# Patient Record
Sex: Female | Born: 1980 | Race: Black or African American | Hispanic: No | Marital: Single | Smoking: Never smoker
Health system: Southern US, Community
[De-identification: ages and names within clinical notes are randomized; demographics above are authoritative.]

## PROBLEM LIST (undated history)

## (undated) DIAGNOSIS — Z8759 Personal history of other complications of pregnancy, childbirth and the puerperium: Secondary | ICD-10-CM

## (undated) DIAGNOSIS — Z8619 Personal history of other infectious and parasitic diseases: Secondary | ICD-10-CM

## (undated) DIAGNOSIS — Z789 Other specified health status: Secondary | ICD-10-CM

## (undated) DIAGNOSIS — D6859 Other primary thrombophilia: Secondary | ICD-10-CM

## (undated) DIAGNOSIS — D649 Anemia, unspecified: Secondary | ICD-10-CM

## (undated) HISTORY — DX: Other primary thrombophilia: D68.59

## (undated) HISTORY — DX: Personal history of other complications of pregnancy, childbirth and the puerperium: Z87.59

## (undated) HISTORY — DX: Personal history of other infectious and parasitic diseases: Z86.19

---

## 2003-12-23 ENCOUNTER — Emergency Department (HOSPITAL_COMMUNITY): Admission: EM | Admit: 2003-12-23 | Discharge: 2003-12-23 | Payer: Self-pay | Admitting: Emergency Medicine

## 2004-06-04 ENCOUNTER — Emergency Department (HOSPITAL_COMMUNITY): Admission: EM | Admit: 2004-06-04 | Discharge: 2004-06-04 | Payer: Self-pay | Admitting: Emergency Medicine

## 2004-09-20 ENCOUNTER — Emergency Department (HOSPITAL_COMMUNITY): Admission: EM | Admit: 2004-09-20 | Discharge: 2004-09-20 | Payer: Self-pay | Admitting: Emergency Medicine

## 2004-12-31 ENCOUNTER — Inpatient Hospital Stay (HOSPITAL_COMMUNITY): Admission: AD | Admit: 2004-12-31 | Discharge: 2004-12-31 | Payer: Self-pay | Admitting: *Deleted

## 2005-01-11 ENCOUNTER — Inpatient Hospital Stay (HOSPITAL_COMMUNITY): Admission: AD | Admit: 2005-01-11 | Discharge: 2005-01-11 | Payer: Self-pay | Admitting: *Deleted

## 2005-05-10 ENCOUNTER — Ambulatory Visit (HOSPITAL_COMMUNITY): Admission: RE | Admit: 2005-05-10 | Discharge: 2005-05-10 | Payer: Self-pay | Admitting: Obstetrics and Gynecology

## 2005-05-20 ENCOUNTER — Inpatient Hospital Stay (HOSPITAL_COMMUNITY): Admission: AD | Admit: 2005-05-20 | Discharge: 2005-05-20 | Payer: Self-pay | Admitting: Obstetrics and Gynecology

## 2005-05-23 ENCOUNTER — Inpatient Hospital Stay (HOSPITAL_COMMUNITY): Admission: AD | Admit: 2005-05-23 | Discharge: 2005-05-26 | Payer: Self-pay | Admitting: Obstetrics and Gynecology

## 2005-05-23 ENCOUNTER — Encounter (INDEPENDENT_AMBULATORY_CARE_PROVIDER_SITE_OTHER): Payer: Self-pay | Admitting: Specialist

## 2005-06-17 IMAGING — US US OB FOLLOW-UP
1 series · 18 of 23 positions shown · non-contrast
Comparison: none

CLINICAL DATA: 23-year-old.  G1 P0 with LMP of 08/23/04.

[Series 1: us ob re-eval · 18 of 23 slices shown]
[im 1/23]
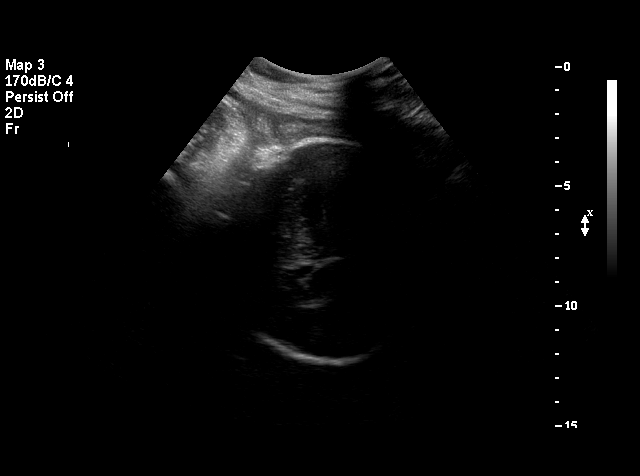
[im 2/23]
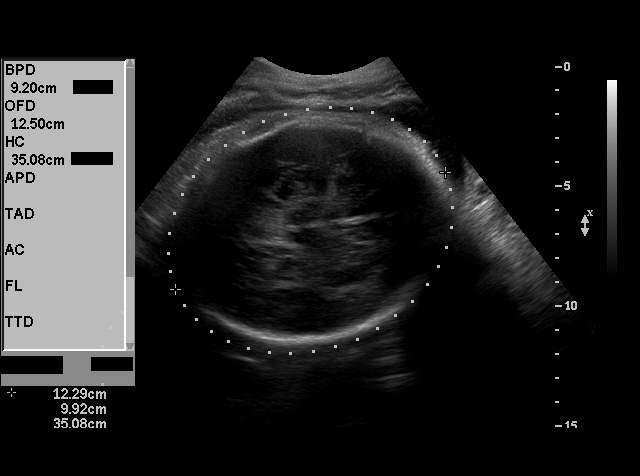
[im 4/23]
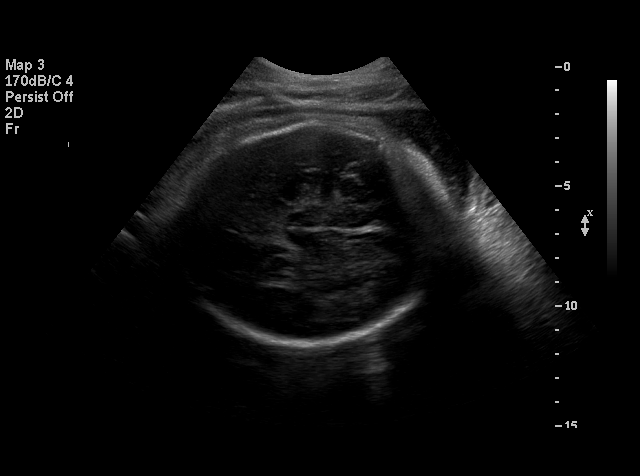
[im 5/23]
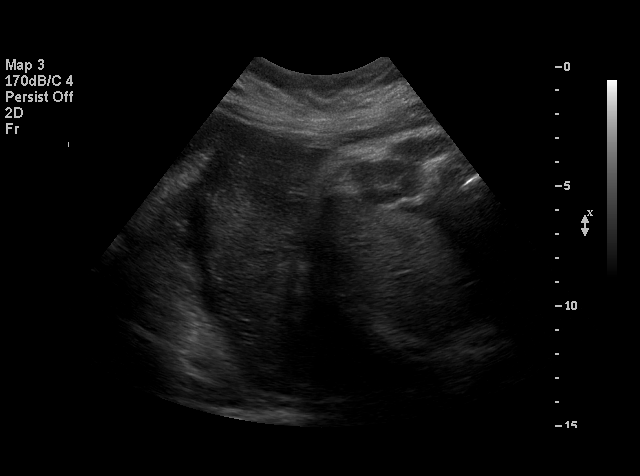
[im 6/23]
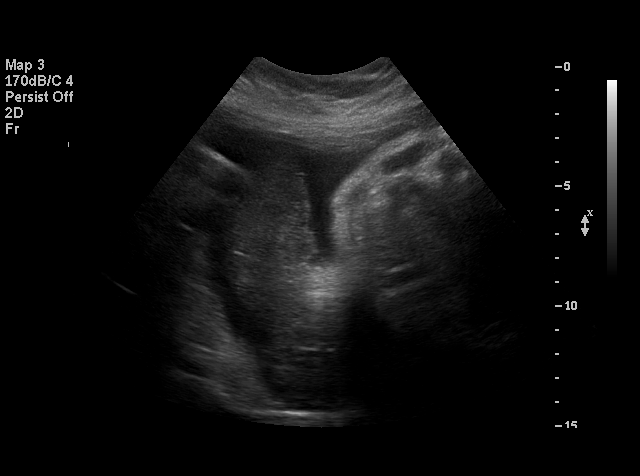
[im 8/23]
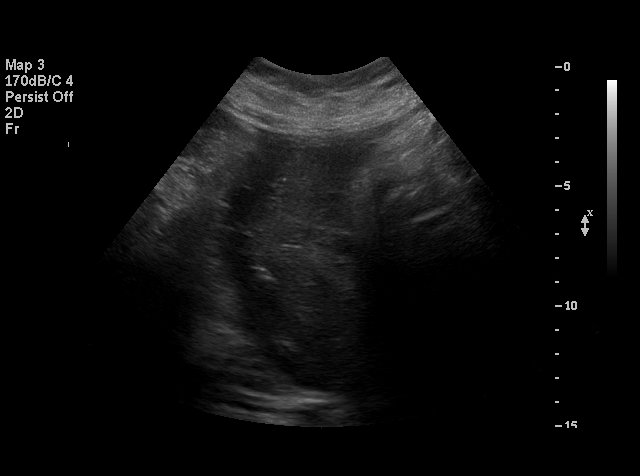
[im 9/23]
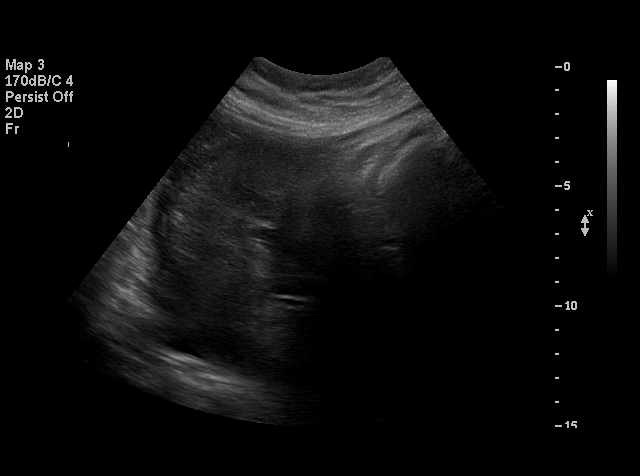
[im 10/23]
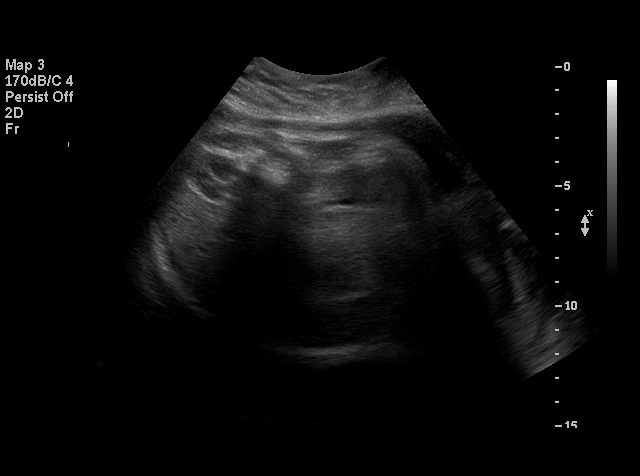
[im 11/23]
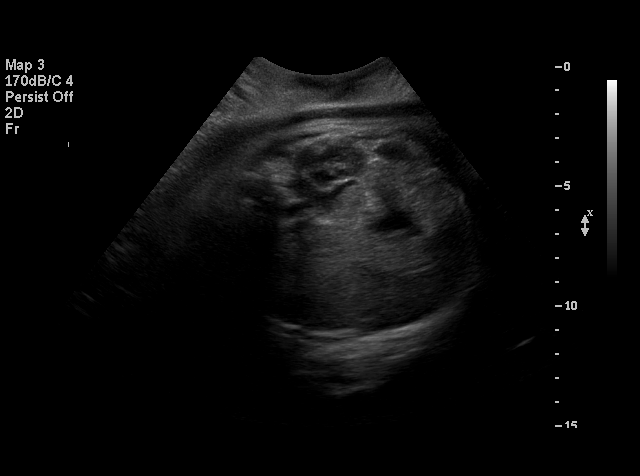
[im 13/23]
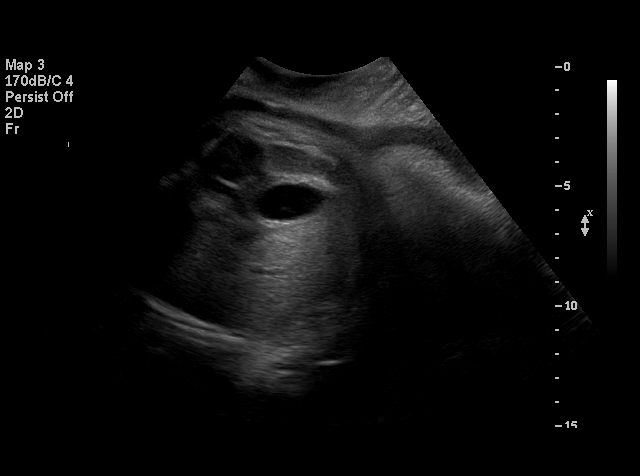
[im 14/23]
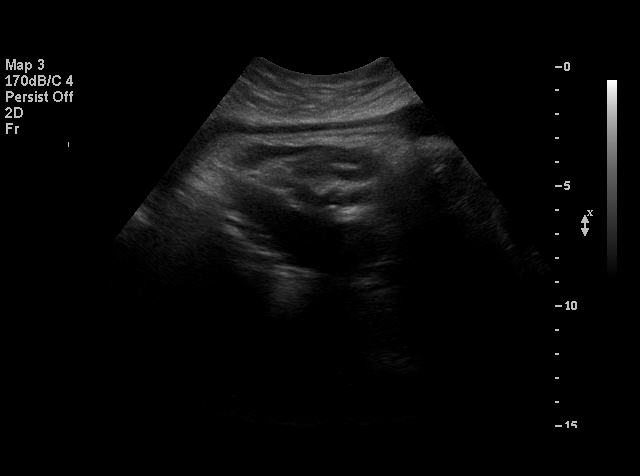
[im 15/23]
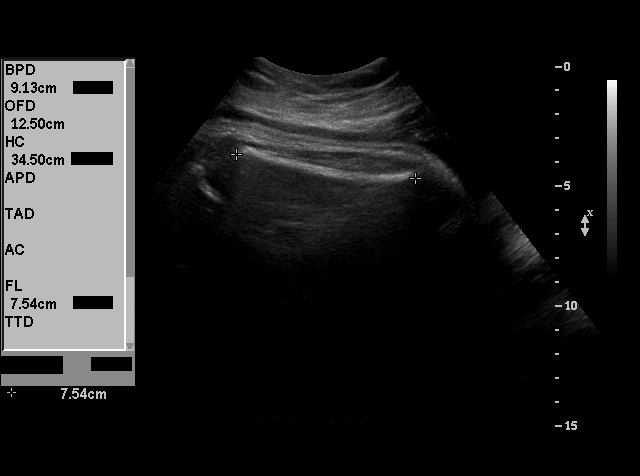
[im 16/23]
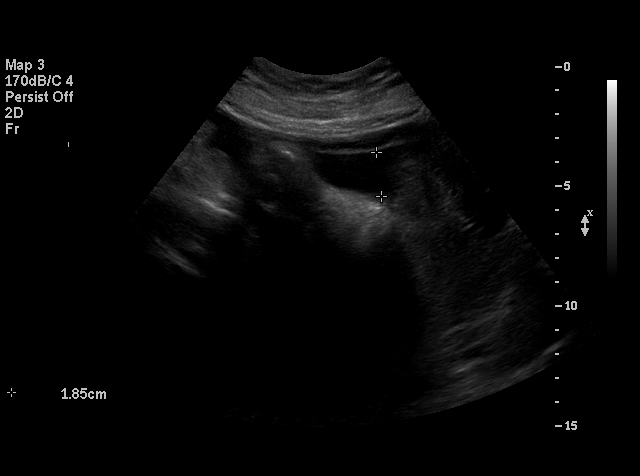
[im 18/23]
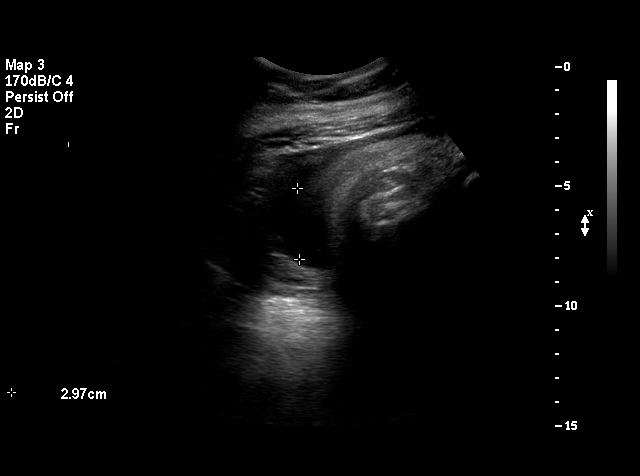
[im 19/23]
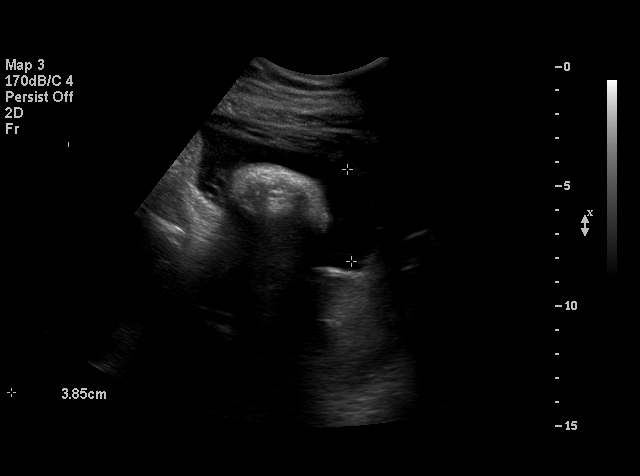
[im 20/23]
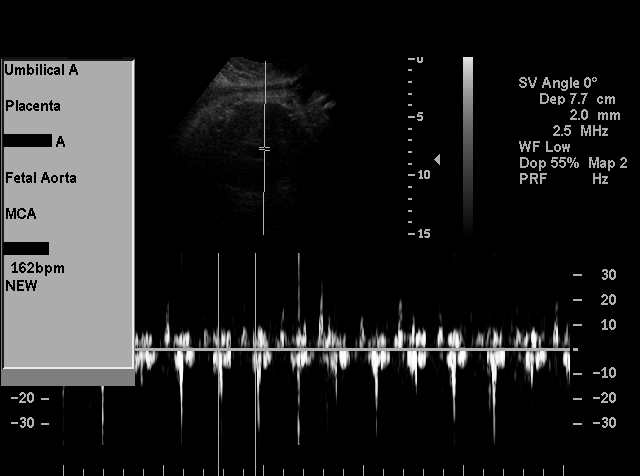
[im 22/23]
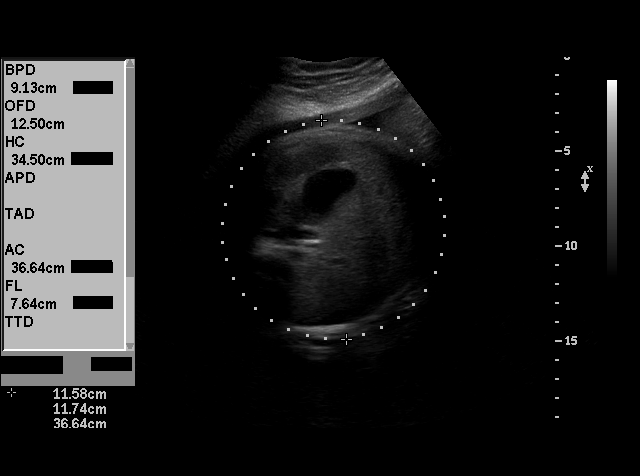
[im 23/23]
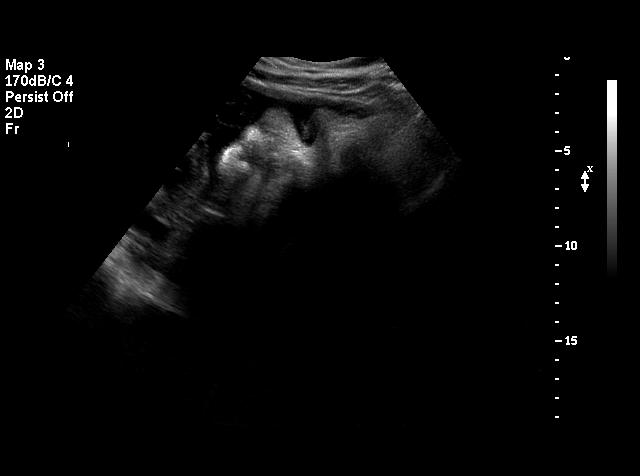

[18 of 23 positions shown; findings below may reference images not displayed]

OBSTETRICAL ULTRASOUND RE-EVALUATION:
Number of Fetuses:  1
Heart Rate:  162
Movement:  Yes
Breathing:  Yes
Presentation:  Cephalic
Placental Location:  Posterior
Grade:  II
Previa:  No
Amniotic Fluid (subjective):  Normal
Amniotic Fluid (objective):  11.3 cm AFI (5th -95th%ile = 7.5 ? 24.4 cm for 37 wks)

FETAL BIOMETRY
BPD:  9.1 cm   37 w 1 d
HC:  34.8 cm   40 w 3 d
AC:  36.3 cm   40 w 2 d
FL:  7.6 cm   39 w 0 d

Mean GA:  39 w 2 d
Assigned GA:  37 w 1 d

EFW:  5929 g (H) Greater than 90th%ile (90th = 5885 g) For 37 wks

FETAL ANATOMY
Lateral Ventricles:  Visualized 
Thalami/CSP:  Previously seen 
Posterior Fossa:  Previously seen 
Nuchal Region:  Previously seen 
Spine:  Previously seen 
4 Chamber Heart on Left:  Previously seen 
Stomach on Left:  Visualized 
3 Vessel Cord:  Previously seen 
Cord Insertion Site:  Previously seen 
Kidneys:  Visualized   
Bladder:  Visualized 
Extremities:  Previously seen 

MATERNAL UTERINE AND ADNEXAL FINDINGS
Cervix:  Not evaluated.
IMPRESSION: 1.  Single living intrauterine fetus in cephalic presentation which is large for gestational age. Estimated fetal weight is greater than 90th percentile for 37 weeks.  Abdominal circumference measures 40 weeks and 2 days.  
2.  Normal amniotic fluid volume.

## 2005-07-01 ENCOUNTER — Other Ambulatory Visit: Admission: RE | Admit: 2005-07-01 | Discharge: 2005-07-01 | Payer: Self-pay | Admitting: Obstetrics and Gynecology

## 2006-05-10 ENCOUNTER — Emergency Department: Payer: Self-pay | Admitting: Emergency Medicine

## 2007-06-13 ENCOUNTER — Emergency Department: Payer: Self-pay | Admitting: Emergency Medicine

## 2007-06-14 ENCOUNTER — Emergency Department: Payer: Self-pay | Admitting: Emergency Medicine

## 2008-02-08 ENCOUNTER — Emergency Department (HOSPITAL_COMMUNITY): Admission: EM | Admit: 2008-02-08 | Discharge: 2008-02-08 | Payer: Self-pay | Admitting: Emergency Medicine

## 2008-04-29 ENCOUNTER — Ambulatory Visit: Payer: Self-pay | Admitting: Family Medicine

## 2009-09-18 ENCOUNTER — Emergency Department (HOSPITAL_COMMUNITY): Admission: EM | Admit: 2009-09-18 | Discharge: 2009-09-18 | Payer: Self-pay | Admitting: Emergency Medicine

## 2010-01-16 ENCOUNTER — Emergency Department (HOSPITAL_COMMUNITY): Admission: EM | Admit: 2010-01-16 | Discharge: 2010-01-16 | Payer: Self-pay | Admitting: Emergency Medicine

## 2011-02-22 ENCOUNTER — Emergency Department (HOSPITAL_COMMUNITY)
Admission: EM | Admit: 2011-02-22 | Discharge: 2011-02-22 | Disposition: A | Discharge status: Home / Self Care | Payer: Self-pay | Attending: Emergency Medicine | Admitting: Emergency Medicine

## 2011-02-22 DIAGNOSIS — R300 Dysuria: Secondary | ICD-10-CM | POA: Insufficient documentation

## 2011-02-22 LAB — URINE MICROSCOPIC-ADD ON

## 2011-02-22 LAB — URINALYSIS, ROUTINE W REFLEX MICROSCOPIC
Bilirubin Urine: NEGATIVE
Ketones, ur: NEGATIVE mg/dL
Nitrite: NEGATIVE
Protein, ur: NEGATIVE mg/dL
Specific Gravity, Urine: 1.017 (ref 1.005–1.030)
Urine Glucose, Fasting: NEGATIVE mg/dL
Urobilinogen, UA: 0.2 mg/dL (ref 0.0–1.0)
pH: 7 (ref 5.0–8.0)

## 2011-02-22 LAB — POCT PREGNANCY, URINE: Preg Test, Ur: NEGATIVE

## 2011-03-29 LAB — PREGNANCY, URINE: Preg Test, Ur: NEGATIVE

## 2011-05-10 NOTE — Op Note (Signed)
NAMEMARVENA, [name]           ACCOUNT NO.:  [id number]   MEDICAL RECORD NO.:  [id number]          PATIENT TYPE:  INP   LOCATION:  9107                          FACILITY:  [facility]   PHYSICIAN:  James A. Ashley Royalty, M.D.DATE OF BIRTH:  1981/06/28   DATE OF PROCEDURE:  05/23/2005  DATE OF DISCHARGE:                                 OPERATIVE REPORT   PREOPERATIVE DIAGNOSES:  1.  Intrauterine pregnancy at term.  2.  Meconium-stained amniotic fluid.  3.  Late decelerations.   POSTOPERATIVE DIAGNOSES:  1.  Intrauterine pregnancy at term.  2.  Meconium-stained amniotic fluid.  3.  Late decelerations.  4.  Pathology pending.   OPERATION/PROCEDURE:  Primary low transverse cesarean section.   SURGEON:  Rudy Jew. Ashley Royalty, M.D.   ANESTHESIA:  Epidural.   FINDINGS:  A 7 pound 13 ounce female, Apgars 8 at one minute and 9 at five  minutes, sent to the newborn nursery.  Arterial cord PH 7.27.  Sponge,  needle and instrument count reported as correct x2.   ESTIMATED BLOOD LOSS:  800 mL.   COMPLICATIONS:  None.   PACKS AND DRAINS:  Foley.   DESCRIPTION OF PROCEDURE:  The patient was taken to the operating room and  placed in the dorsal supine position.  Foley catheter had been previously  placed.  Epidural anesthesia was dosed to surgical levels.  She was prepped  and draped in the usual manner for abdominal surgery.   A Pfannenstiel incision was made down to the level of the fascia.  The  fascia was nicked with the knife and incised transversely with Mayo  scissors.  Underlying rectus muscles were separated from the fascia using  sharp and blunt dissection.  The rectus muscles were severed in the midline  exposing the peritoneum which was elevated with hemostats atraumatically  with Metzenbaum scissors.  Incision was extended longitudinally.  The uterus  was identified and a bladder flap created by incising the anterior uterine  serosa.  The bladder was dissected inferiorly and held  in place with the  bladder blade.  The uterus was then entered for a low transverse incision  using sharp and blunt dissection.  The fluid was found to be meconium  stained.  The infant's head was delivered.  DeLee suction was employed at  this time of the oral and nasopharynx.  Full delivery was then accomplished.  The cord was triply clamped, cut and the infant given immediately to the  awaiting pediatrics team.  Arterial cord PH was taken __________  segment.  Then regular cord blood was obtained.  Placenta and membranes were removed  in their entirety and submitted to pathology for histologic studies.  The  uterus was exteriorized.  The uterus was closed with two running layers with  #1 Vicryl, reinforced in with a running locking layer.  The second was a  running, intermittent locking, and imbricating layer.  One additional figure-  of-eight suture was required to obtain hemostasis.  Hemostasis was noted.  The uterus, tubes and ovaries were inspected and found to be normal.  They  were returned to the abdominal cavity.  Copious irrigation was accomplished.  The peritoneum was then closed with 3-0 Vicryl in a running fashion.  The  fascia was closed with 0 Vicryl in a running fashion.  The skin was closed  with staples.  The patient tolerated the procedure extremely well and was  returned to the recovery room in good condition.      JAM/MEDQ  D:  05/23/2005  T:  05/24/2005  Job:  [id number]

## 2011-05-10 NOTE — Discharge Summary (Signed)
NAMEKRISY, DIX           ACCOUNT NO.:  [id number]   MEDICAL RECORD NO.:  [id number]          PATIENT TYPE:  INP   LOCATION:  9107                          FACILITY:  [facility]   PHYSICIAN:  James A. Ashley Royalty, M.D.DATE OF BIRTH:  1981-02-05   DATE OF ADMISSION:  05/23/2005  DATE OF DISCHARGE:  05/26/2005                                 DISCHARGE SUMMARY   ADMISSION DIAGNOSES:  1.  Intrauterine pregnancy at term.  2.  Meconium-stained fluid.  3.  Late decelerations.   DISCHARGE DIAGNOSES:  1.  Intrauterine pregnancy at term.  2.  Meconium-stained fluid.  3.  Late decelerations.  4.  Pathology pending.   PROCEDURES:  Low cervical transverse cesarean section.   HISTORY OF PRESENT ILLNESS:  The patient is a 30 year old primigravida with  an LMP of August 23, 2004; [facility] May 30, 2005. Prenatal course was  complicated by fibroid uterus. The patient also had a history of a positive  PPD with a normal chest x-ray 5 years ago. She transferred her care from  [facility] to [facility] at  approximately 21 weeks.   LABORATORY:  Blood type O positive, antibody screen negative. RPR, HbsAg,  HIV nonreactive.   HOSPITAL COURSE AND TREATMENT:  The patient was admitted on May 23, 2005  with spontaneous onset of labor. During the course of labor she began to  develop repetitive late decelerations. Primary low cervical transverse  cesarean section was then performed by Dr. Sylvester Harder under epidural  anesthesia. The patient was delivered of a 7-pound 13-ounce female, Apgars 8  and 9, arterial cord pH 7.27, estimated blood loss 800 mL.   POSTPARTUM COURSE:  The patient remained afebrile and had no difficulties,  was able to be discharged in satisfactory condition on May 26, 2005.   DISPOSITION:  Follow up in the office in 2 weeks.      Elwyn Lade  MKH/MEDQ  D:  07/03/2005  T:  07/03/2005  Job:  [id number]

## 2011-09-13 LAB — POCT PREGNANCY, URINE
Operator id: 108131
Preg Test, Ur: NEGATIVE

## 2012-01-20 ENCOUNTER — Emergency Department: Payer: Self-pay | Admitting: Emergency Medicine

## 2012-01-22 LAB — BETA STREP CULTURE(ARMC)

## 2012-05-15 ENCOUNTER — Emergency Department (INDEPENDENT_AMBULATORY_CARE_PROVIDER_SITE_OTHER)
Admission: EM | Admit: 2012-05-15 | Discharge: 2012-05-15 | Disposition: A | Discharge status: Home / Self Care | Payer: Self-pay | Source: Home / Self Care | Attending: Family Medicine | Admitting: Family Medicine

## 2012-05-15 ENCOUNTER — Encounter (HOSPITAL_COMMUNITY): Payer: Self-pay | Admitting: *Deleted

## 2012-05-15 DIAGNOSIS — J019 Acute sinusitis, unspecified: Secondary | ICD-10-CM

## 2012-05-15 DIAGNOSIS — H6690 Otitis media, unspecified, unspecified ear: Secondary | ICD-10-CM

## 2012-05-15 DIAGNOSIS — H6691 Otitis media, unspecified, right ear: Secondary | ICD-10-CM

## 2012-05-15 MED ORDER — FLUTICASONE PROPIONATE 50 MCG/ACT NA SUSP: 1.0000 | Freq: Two times a day (BID) | NASAL | Status: DC

## 2012-05-15 MED ORDER — CETIRIZINE HCL 10 MG PO TABS: 10.0000 mg | ORAL_TABLET | Freq: Every day | ORAL | Status: DC

## 2012-05-15 MED ORDER — AMOXICILLIN-POT CLAVULANATE 875-125 MG PO TABS: 1.0000 | ORAL_TABLET | Freq: Two times a day (BID) | ORAL | Status: AC

## 2012-05-15 NOTE — ED Provider Notes (Signed)
History     CSN: [id number]  Arrival date & time 05/15/12  4696   First MD Initiated Contact with Patient 05/15/12 0820      Chief Complaint  Patient presents with  . Facial Pain    (Consider location/radiation/quality/duration/timing/severity/associated sxs/prior treatment) Patient is a 31 y.o. female presenting with URI. The history is provided by the patient.  URI The primary symptoms include ear pain and cough. The current episode started 3 to 5 days ago. This is a new problem. The problem has not changed since onset. Symptoms associated with the illness include facial pain, sinus pressure, congestion and rhinorrhea.    History reviewed. No pertinent past medical history.  Past Surgical History  Procedure Date  . Cesarean section     Family History  Problem Relation Age of Onset  . Cancer Mother   . Cancer Father     History  Substance Use Topics  . Smoking status: Not on file  . Smokeless tobacco: Not on file  . Alcohol Use:     OB History    Grav Para Term Preterm Abortions TAB SAB Ect Mult Living                  Review of Systems  Constitutional: Negative.   HENT: Positive for ear pain, congestion, rhinorrhea and sinus pressure.   Respiratory: Positive for cough.     Allergies  Review of patient's allergies indicates no known allergies.  Home Medications   Current Outpatient Rx  Name Route Sig Dispense Refill  . DEXTROMETHORPHAN POLISTIREX ER 30 MG/5ML PO LQCR Oral Take 60 mg by mouth as needed.    . AMOXICILLIN-POT CLAVULANATE 875-125 MG PO TABS Oral Take 1 tablet by mouth 2 (two) times daily. 20 tablet 0  . CETIRIZINE HCL 10 MG PO TABS Oral Take 1 tablet (10 mg total) by mouth daily. One tab daily for allergies 30 tablet 1  . FLUTICASONE PROPIONATE 50 MCG/ACT NA SUSP Nasal Place 1 spray into the nose 2 (two) times daily. 1 g 2    BP 123/76  Pulse 90  Temp(Src) 98.3 F (36.8 C) (Oral)  Resp 18  SpO2 99%  LMP 05/13/2012  Physical Exam    Nursing note and vitals reviewed. Constitutional: She is oriented to person, place, and time. She appears well-developed and well-nourished.  HENT:  Head: Normocephalic.  Right Ear: Tympanic membrane is injected, erythematous and bulging.  Left Ear: Tympanic membrane and external ear normal.  Mouth/Throat: Oropharynx is clear and moist.  Neck: Normal range of motion. Neck supple.  Cardiovascular: Normal rate, regular rhythm and normal heart sounds.   Pulmonary/Chest: Breath sounds normal.  Lymphadenopathy:    She has no cervical adenopathy.  Neurological: She is alert and oriented to person, place, and time.  Skin: Skin is warm and dry.    ED Course  Procedures (including critical care time)  Labs Reviewed - No data to display No results found.   1. Otitis media of right ear   2. Sinusitis acute       MDM          Linna Hoff, MD 05/15/12 [phone]

## 2012-05-15 NOTE — ED Notes (Signed)
Face feels  Swollen      Congested   Fever    Earlier        Decreased   Hearing    r ear       Headache         And  Chest  Discomfort  After  Coughing     Symptoms  X  3  Days   Pt appears  In no  Severe  Distress  Speaking in  Complete  sentances

## 2012-05-15 NOTE — Discharge Instructions (Signed)
Take all of medicine , use tylenol or advil for pain and fever as needed, delsym for cough, drink lots of fluids. see your doctor in 10 - 14 days for ear recheck  As needed

## 2012-07-10 ENCOUNTER — Inpatient Hospital Stay (HOSPITAL_COMMUNITY)
Admission: AD | Admit: 2012-07-10 | Discharge: 2012-07-10 | Disposition: A | Discharge status: Home / Self Care | Payer: Self-pay | Source: Ambulatory Visit | Attending: Obstetrics & Gynecology | Admitting: Obstetrics & Gynecology

## 2012-07-10 ENCOUNTER — Encounter (HOSPITAL_COMMUNITY): Payer: Self-pay | Admitting: *Deleted

## 2012-07-10 ENCOUNTER — Inpatient Hospital Stay (HOSPITAL_COMMUNITY): Payer: Self-pay

## 2012-07-10 DIAGNOSIS — R102 Pelvic and perineal pain: Secondary | ICD-10-CM

## 2012-07-10 DIAGNOSIS — N949 Unspecified condition associated with female genital organs and menstrual cycle: Secondary | ICD-10-CM | POA: Insufficient documentation

## 2012-07-10 DIAGNOSIS — N72 Inflammatory disease of cervix uteri: Secondary | ICD-10-CM | POA: Insufficient documentation

## 2012-07-10 DIAGNOSIS — N912 Amenorrhea, unspecified: Secondary | ICD-10-CM

## 2012-07-10 DIAGNOSIS — R109 Unspecified abdominal pain: Secondary | ICD-10-CM | POA: Insufficient documentation

## 2012-07-10 HISTORY — DX: Other specified health status: Z78.9

## 2012-07-10 LAB — URINALYSIS, ROUTINE W REFLEX MICROSCOPIC
Bilirubin Urine: NEGATIVE
Glucose, UA: NEGATIVE mg/dL
Ketones, ur: NEGATIVE mg/dL
Leukocytes, UA: NEGATIVE
Nitrite: NEGATIVE
Protein, ur: NEGATIVE mg/dL
Specific Gravity, Urine: 1.025 (ref 1.005–1.030)
Urobilinogen, UA: 0.2 mg/dL (ref 0.0–1.0)
pH: 6 (ref 5.0–8.0)

## 2012-07-10 LAB — WET PREP, GENITAL
Clue Cells Wet Prep HPF POC: NONE SEEN
Trich, Wet Prep: NONE SEEN
Yeast Wet Prep HPF POC: NONE SEEN

## 2012-07-10 LAB — URINE MICROSCOPIC-ADD ON

## 2012-07-10 LAB — POCT PREGNANCY, URINE: Preg Test, Ur: NEGATIVE

## 2012-07-10 NOTE — MAU Note (Signed)
Patient states she has had abdominal pain on the right lower side for about 2 weeks. Has not had a period in 9 weeks and has had multiple negative pregnancy tests. Has gained a lot of weight and abdominal bloating on the right side.

## 2012-07-10 NOTE — MAU Note (Signed)
Pt reports she took Plan B May 23 and has not had a period since. Pregnancy test is negative.

## 2012-07-10 NOTE — MAU Provider Note (Signed)
History     CSN: [id number]  Arrival date and time: 07/10/12 9562   First Provider Initiated Contact with Patient 07/10/12 0940      Chief Complaint  Patient presents with  . Abdominal Pain   HPI  Pt reports she took Plan B May 23 and has not had a period since.  Pt reports regular cycles prior to taking Plan B.  LNMP was 05/09/12.   Pregnancy test is negative.  Pt report right sided pelvic pain that started two weeks ago.  Pain is described as bad cramps.     Past Medical History  Diagnosis Date  . No pertinent past medical history     Past Surgical History  Procedure Date  . Cesarean section     Family History  Problem Relation Age of Onset  . Cancer Mother   . Cancer Father     History  Substance Use Topics  . Smoking status: Never Smoker   . Smokeless tobacco: Not on file  . Alcohol Use: Yes     occasional    Allergies: No Known Allergies  Prescriptions prior to admission  Medication Sig Dispense Refill  . cetirizine (ZYRTEC) 10 MG tablet Take 1 tablet (10 mg total) by mouth daily. One tab daily for allergies  30 tablet  1  . fluticasone (FLONASE) 50 MCG/ACT nasal spray Place 1 spray into the nose 2 (two) times daily.  1 g  2    ROS Physical Exam   Blood pressure 126/88, pulse 90, temperature 98.5 F (36.9 C), temperature source Oral, resp. rate 18, height 5' 5.5" (1.664 m), weight 121.337 kg (267 lb 8 oz), last menstrual period 05/09/2012, SpO2 100.00%.  Physical Exam  Constitutional: She is oriented to person, place, and time. She appears well-developed and well-nourished. No distress.  HENT:  Head: Normocephalic.  Eyes: Pupils are equal, round, and reactive to light.  Neck: Normal range of motion. Neck supple.  Cardiovascular: Normal rate, regular rhythm and normal heart sounds.   Respiratory: Effort normal and breath sounds normal.  GI: Soft. Bowel sounds are normal. She exhibits no mass. There is no tenderness. There is no rebound and no  guarding.       Difficult to palpate well secondary to weight  Genitourinary: Vaginal discharge (thin, clear, mucusy) found.       Negative cervical motion tenderness  Neurological: She is alert and oriented to person, place, and time. She has normal reflexes.  Skin: Skin is warm and dry.    MAU Course  Procedures  Results for orders placed during the hospital encounter of 07/10/12 (from the past 24 hour(s))  POCT PREGNANCY, URINE     Status: Normal   Collection Time   07/10/12  8:42 AM      Component Value Range   Preg Test, Ur NEGATIVE  NEGATIVE  URINALYSIS, ROUTINE W REFLEX MICROSCOPIC     Status: Abnormal   Collection Time   07/10/12 10:12 AM      Component Value Range   Color, Urine YELLOW  YELLOW   APPearance CLEAR  CLEAR   Specific Gravity, Urine 1.025  1.005 - 1.030   pH 6.0  5.0 - 8.0   Glucose, UA NEGATIVE  NEGATIVE mg/dL   Hgb urine dipstick SMALL (*) NEGATIVE   Bilirubin Urine NEGATIVE  NEGATIVE   Ketones, ur NEGATIVE  NEGATIVE mg/dL   Protein, ur NEGATIVE  NEGATIVE mg/dL   Urobilinogen, UA 0.2  0.0 - 1.0 mg/dL   Nitrite  NEGATIVE  NEGATIVE   Leukocytes, UA NEGATIVE  NEGATIVE  URINE MICROSCOPIC-ADD ON     Status: Normal   Collection Time   07/10/12 10:12 AM      Component Value Range   Squamous Epithelial / LPF RARE  RARE   WBC, UA 0-2  <3 WBC/hpf   RBC / HPF 0-2  <3 RBC/hpf   Urine-Other MUCOUS PRESENT     Ultrasound: IMPRESSION:  Normal study. No evidence of pelvic mass or other significant  abnormality.   Assessment and Plan  Amenorrhea Pelvic Pain - exam/radiology normal  Plan: DC to home Provided reassurance If pain continues may need to check for non-gyn issue > refer to [facility] or go to urgent care/emergency room Pt declines provera, will wait and see cycle resumes Refer to gyn clinic for possible Mirena [facility] Scholars[facility])  [facility] 07/10/2012, 9:41 AM

## 2012-07-11 LAB — GC/CHLAMYDIA PROBE AMP, GENITAL
Chlamydia, DNA Probe: NEGATIVE
GC Probe Amp, Genital: NEGATIVE

## 2012-07-11 NOTE — MAU Provider Note (Signed)
Attestation of Attending Supervision of Advanced Practitioner (CNM/NP): Evaluation and management procedures were performed by the Advanced Practitioner under my supervision and collaboration.  I have reviewed the Advanced Practitioner's note and chart, and I agree with the management and plan.  HARRAWAY-SMITH, Ismael Treptow 3:03 PM     

## 2012-07-13 ENCOUNTER — Encounter: Payer: Self-pay | Admitting: *Deleted

## 2012-08-17 ENCOUNTER — Encounter: Payer: Self-pay | Admitting: Obstetrics & Gynecology

## 2012-08-17 ENCOUNTER — Ambulatory Visit (INDEPENDENT_AMBULATORY_CARE_PROVIDER_SITE_OTHER): Payer: Self-pay | Admitting: Obstetrics & Gynecology

## 2012-08-17 VITALS — BP 120/67 | HR 86 | Temp 97.2°F | Ht 65.5 in | Wt 263.8 lb

## 2012-08-17 DIAGNOSIS — N912 Amenorrhea, unspecified: Secondary | ICD-10-CM

## 2012-08-17 DIAGNOSIS — Z3200 Encounter for pregnancy test, result unknown: Secondary | ICD-10-CM

## 2012-08-17 LAB — POCT PREGNANCY, URINE: Preg Test, Ur: NEGATIVE

## 2012-08-17 MED ORDER — NORGESTIMATE-ETH ESTRADIOL 0.25-35 MG-MCG PO TABS: 1.0000 | ORAL_TABLET | Freq: Every day | ORAL | Status: DC

## 2012-08-17 NOTE — Progress Notes (Signed)
Subjective:     Patient ID: [name], female   DOB: 16-Mar-1981, 31 y.o.   MRN: [id number]  HPI  [id number] who took Plan B in May and has not had a cycle since that time.  Pt worried not having cycles only because she is terrified of becoming pregnant.  Has not had unprotected intercourse since May.      Review of Systems     Objective:   Physical Exam BP 120/67  Pulse 86  Temp 97.2 F (36.2 C) (Oral)  Ht 5' 5.5" (1.664 m)  Wt 263 lb 12.8 oz (119.659 kg)  BMI 43.23 kg/m2  UPT: neg Pelvic sono: July normal      Assessment:     Amenorrhea- suspect secondary to Plan B UPT neg x2    Plan:     Orthocycle 1 po q day F/u in 3 months or sooner prn D/W pt contraception options.  Wants OCPs for now  Nada L. Harraway-Smith, M.D., [place]n Core

## 2012-08-17 NOTE — Patient Instructions (Signed)
Oral Contraception Information Oral contraceptives (OCs) are medicines taken to prevent pregnancy. OCs work by preventing the ovaries from releasing eggs. The hormones in OCs also cause the cervical mucus to thicken, preventing the sperm from entering the uterus. The hormones also cause the uterine lining to become thin, not allowing a fertilized egg to attach to the inside of the uterus. OCs are highly effective when taken exactly as prescribed. However, OCs do not prevent sexually transmitted diseases (STDs). Safe sex practices, such as using condoms along with the pill, can help prevent STDs.  Before taking the pill, you may have a physical exam and Pap test. Your caregiver may order blood tests that may be necessary. Your caregiver will make sure you are a good candidate for oral contraception. Discuss with your caregiver the possible side effects of the OC you may be prescribed. When starting an OC, it can take 2 to 3 months for the body to adjust to the changes in hormone levels in your body.  TYPES OF ORAL CONTRACEPTION  The combination pill. This pill contains estrogen and progestin (synthetic progesterone) hormones. The combination pill comes in either 21-day or 28-day packs. With 21-day packs, you do not take pills for 7 days after the last pill. With 28-day packs, the pill is taken every day. The last 7 pills are without hormones. Certain types of pills have more than 21 hormone-containing pills.   The minipill. This pill contains the progesterone hormone only. It is taken every day continuously. The minipill comes in packs of 91 pills. The first 84 pills contain the hormones, and the last 7 pills do not. The last 7 days are when you will have your menstrual period. You may experience irregular spotting.  ADVANTAGES  Decreases premenstrual symptoms.   Treats menstrual period cramps.   Regulates the menstrual cycle.   Decreases a heavy menstrual flow.   Treats acne.   Treats abnormal  uterine bleeding.   Treats chronic pelvic pain.   Treats polycystic ovarian syndrome.   Treats endometriosis.   Can be used as emergency contraception.  DISADVANTAGES OCs can be less effective if:  You forget to take the pill at the same time every day.   You have a stomach or intestinal disease that lessens the absorption of the pill.   You take OCs with other medicines that make OCs less effective.   You take expired OCs.   You forget to restart the pill on day 7, when using the packs of 21 pills.  Document Released: 03/01/2003 Document Revised: 11/28/2011 Document Reviewed: 04/17/2011 [facility] Patient Information 2012 [address] [place], [place].Secondary Amenorrhea  Secondary amenorrhea is the stopping of menstrual flow for 3 to 6 months in a female who has previously had periods. There are many possible causes. Most of these causes are not serious. Usually treating the underlying problem causing the loss of menses will return your periods to normal. CAUSES  Some common and uncommon causes of not menstruating include:  Malnutrition.   Low blood sugar (hypoglycemia).   Polycystic ovarian disease.   Stress or fear.   Breastfeeding.   Hormone imbalance.   Ovarian failure.   Medications.   Extreme obesity.   Cystic fibrosis.   Low body weight or drastic weight reduction from any cause.   Early menopause.   Removal of ovaries or uterus.   Contraceptives.   Illness.   Long term (chronic) illnesses.   Cushing's syndrome.   Thyroid problems.   Birth control pills, patches, or vaginal  rings for birth control.  DIAGNOSIS  This diagnosis is made by your caregiver taking a medical history and doing a physical exam. Pregnancy must be ruled out. Often times, numerous blood tests of different hormones in the body may be measured. Urine testing may be done. Specialized x-rays may have to be done as well as measuring the body mass index (BMI). TREATMENT  Treatment depends on  the cause of the amenorrhea. If an eating disorder is present, this can be treated with an adequate diet and therapy. Chronic illnesses may improve with treatment of the illness. Overall, the outlook is good. The amenorrhea may be corrected with medications, lifestyle changes, or surgery. If the amenorrhea cannot be corrected, it is sometimes possible to create a false menstruation with medications. Document Released: 01/20/2007 Document Revised: 11/28/2011 Document Reviewed: 11/27/2007 [facility] Patient Information 2012 [place], [place].

## 2014-10-24 ENCOUNTER — Encounter: Payer: Self-pay | Admitting: Obstetrics & Gynecology

## 2015-03-03 ENCOUNTER — Emergency Department: Payer: Self-pay | Admitting: Emergency Medicine

## 2016-07-05 ENCOUNTER — Encounter (HOSPITAL_COMMUNITY): Payer: Self-pay | Admitting: Emergency Medicine

## 2016-07-05 ENCOUNTER — Ambulatory Visit (HOSPITAL_COMMUNITY)
Admission: EM | Admit: 2016-07-05 | Discharge: 2016-07-05 | Disposition: A | Discharge status: Home / Self Care | Payer: BLUE CROSS/BLUE SHIELD | Attending: Internal Medicine | Admitting: Internal Medicine

## 2016-07-05 DIAGNOSIS — R300 Dysuria: Secondary | ICD-10-CM

## 2016-07-05 DIAGNOSIS — N390 Urinary tract infection, site not specified: Secondary | ICD-10-CM | POA: Diagnosis not present

## 2016-07-05 LAB — POCT PREGNANCY, URINE: Preg Test, Ur: NEGATIVE

## 2016-07-05 LAB — POCT URINALYSIS DIP (DEVICE)
Bilirubin Urine: NEGATIVE
Glucose, UA: NEGATIVE mg/dL
Ketones, ur: NEGATIVE mg/dL
Nitrite: NEGATIVE
Protein, ur: 30 mg/dL — AB
Specific Gravity, Urine: >=1.03 (ref 1.005–1.030)
Urobilinogen, UA: 0.2 mg/dL (ref 0.0–1.0)
pH: 6 (ref 5.0–8.0)

## 2016-07-05 MED ORDER — NITROFURANTOIN MONOHYD MACRO 100 MG PO CAPS: 100.0000 mg | ORAL_CAPSULE | Freq: Two times a day (BID) | ORAL | Status: DC

## 2016-07-05 MED ORDER — PHENAZOPYRIDINE HCL 200 MG PO TABS: ORAL_TABLET | ORAL | Status: DC

## 2016-07-05 NOTE — ED Notes (Signed)
PT reports painful urination for 2 weeks. PT now has blood in her urine. PT also reports her period is late.

## 2016-07-05 NOTE — Discharge Instructions (Signed)
You have a urinary tract infection. Treat with a lot of water and hydration. Complete your antibiotic and the pyridium is for bladder spasms. If you worsen please f/u. You are not pregnant.    Urinary Tract Infection Urinary tract infections (UTIs) can develop anywhere along your urinary tract. Your urinary tract is your body's drainage system for removing wastes and extra water. Your urinary tract includes two kidneys, two ureters, a bladder, and a urethra. Your kidneys are a pair of bean-shaped organs. Each kidney is about the size of your fist. They are located below your ribs, one on each side of your spine. CAUSES Infections are caused by microbes, which are microscopic organisms, including fungi, viruses, and bacteria. These organisms are so small that they can only be seen through a microscope. Bacteria are the microbes that most commonly cause UTIs. SYMPTOMS  Symptoms of UTIs may vary by age and gender of the patient and by the location of the infection. Symptoms in [name] women typically include a frequent and intense urge to urinate and a painful, burning feeling in the bladder or urethra during urination. Older women and men are more likely to be tired, shaky, and weak and have muscle aches and abdominal pain. A fever may mean the infection is in your kidneys. Other symptoms of a kidney infection include pain in your back or sides below the ribs, nausea, and vomiting. DIAGNOSIS To diagnose a UTI, your caregiver will ask you about your symptoms. Your caregiver will also ask you to provide a urine sample. The urine sample will be tested for bacteria and white blood cells. White blood cells are made by your body to help fight infection. TREATMENT  Typically, UTIs can be treated with medication. Because most UTIs are caused by a bacterial infection, they usually can be treated with the use of antibiotics. The choice of antibiotic and length of treatment depend on your symptoms and the type of  bacteria causing your infection. HOME CARE INSTRUCTIONS  If you were prescribed antibiotics, take them exactly as your caregiver instructs you. Finish the medication even if you feel better after you have only taken some of the medication.  Drink enough water and fluids to keep your urine clear or pale yellow.  Avoid caffeine, tea, and carbonated beverages. They tend to irritate your bladder.  Empty your bladder often. Avoid holding urine for long periods of time.  Empty your bladder before and after sexual intercourse.  After a bowel movement, women should cleanse from front to back. Use each tissue only once. SEEK MEDICAL CARE IF:   You have back pain.  You develop a fever.  Your symptoms do not begin to resolve within 3 days. SEEK IMMEDIATE MEDICAL CARE IF:   You have severe back pain or lower abdominal pain.  You develop chills.  You have nausea or vomiting.  You have continued burning or discomfort with urination. MAKE SURE YOU:   Understand these instructions.  Will watch your condition.  Will get help right away if you are not doing well or get worse.   This information is not intended to replace advice given to you by your health care provider. Make sure you discuss any questions you have with your health care provider.   Document Released: 09/18/2005 Document Revised: 08/30/2015 Document Reviewed: 01/17/2012 [employer] Interactive Patient Education [employer]2016 [employer]

## 2016-07-05 NOTE — ED Provider Notes (Signed)
CSN: [id number]     Arrival date & time 07/05/16  1408 History   First MD Initiated Contact with Patient 07/05/16 1523     Chief Complaint  Patient presents with  . Urinary Tract Infection   (Consider location/radiation/quality/duration/timing/severity/associated sxs/prior Treatment) HPI Comments: 35 yo black female presents with dysuria, gross hematuria, and frequency x 3-4 days, now worsening. No fever, chills, N,V. Late on menses.   The history is provided by the patient.    Past Medical History  Diagnosis Date  . No pertinent past medical history    Past Surgical History  Procedure Laterality Date  . Cesarean section     Family History  Problem Relation Age of Onset  . Cancer Mother   . Cancer Father    Social History  Substance Use Topics  . Smoking status: Never Smoker   . Smokeless tobacco: None  . Alcohol Use: Yes     Comment: occasional   OB History    Gravida Para Term Preterm AB TAB SAB Ectopic Multiple Living   1 1 1       1      Review of Systems  Constitutional: Negative for fever and chills.  Gastrointestinal: Negative.   Genitourinary: Positive for dysuria, frequency and hematuria. Negative for vaginal bleeding and pelvic pain.  Skin: Negative.     Allergies  Review of patient's allergies indicates no known allergies.  Home Medications   Prior to Admission medications   Medication Sig Start Date End Date Taking? Authorizing Provider  cetirizine (ZYRTEC) 10 MG tablet Take 1 tablet (10 mg total) by mouth daily. One tab daily for allergies 05/15/12 05/15/13  Linna HoffJames D Kindl, MD  fluticasone [facility](FLONASE) 50 MCG/ACT nasal spray Place 1 spray into the nose 2 (two) times daily. 05/15/12 05/15/13  Linna HoffJames D Kindl, MD  nitrofurantoin, macrocrystal-monohydrate, (MACROBID) 100 MG capsule Take 1 capsule (100 mg total) by mouth 2 (two) times daily. 07/05/16   Riki SheerMichelle G Young, PA-C  norgestimate-ethinyl estradiol (ORTHO-CYCLEN, 28,) 0.25-35 MG-MCG tablet Take 1 tablet by  mouth daily. 08/17/12 08/17/13  Willodean Rosenthalarolyn Harraway-Smith, MD  phenazopyridine (PYRIDIUM) 200 MG tablet 1 tablet po tid prn urinary spasms , up to 2 days only 07/05/16   Riki SheerMichelle G Young, PA-C   Meds Ordered and Administered this Visit  Medications - No data to display  BP 119/83 mmHg  Pulse 80  Temp(Src) 97.7 F (36.5 C) (Oral)  Resp 16  SpO2 100%  LMP 05/30/2016 No data found.   Physical Exam  Constitutional: She is oriented to person, place, and time. She appears well-developed and well-nourished. No distress.  HENT:  Head: Normocephalic and atraumatic.  Abdominal:  No CVA tenderness to percussion  Neurological: She is alert and oriented to person, place, and time.  Skin: Skin is warm and dry. She is not diaphoretic.  Psychiatric: Her behavior is normal.  Nursing note and vitals reviewed.   ED Course  Procedures (including critical care time)  Labs Review Labs Reviewed  POCT URINALYSIS DIP (DEVICE) - Abnormal; Notable for the following:    Hgb urine dipstick MODERATE (*)    Protein, ur 30 (*)    Leukocytes, UA LARGE (*)    All other components within normal limits  POCT PREGNANCY, URINE    Imaging Review No results found.   Visual Acuity Review  Right Eye Distance:   Left Eye Distance:   Bilateral Distance:    Right Eye Near:   Left Eye Near:    Bilateral Near:  MDM   1. UTI (lower urinary tract infection)   2. Dysuria    Fluids, hydration and antibiotic. F/u if needed.     Riki Sheer, PA-C 07/05/16 1609

## 2016-07-11 ENCOUNTER — Ambulatory Visit (HOSPITAL_COMMUNITY)
Admission: EM | Admit: 2016-07-11 | Discharge: 2016-07-11 | Disposition: A | Discharge status: Home / Self Care | Payer: BLUE CROSS/BLUE SHIELD | Attending: Emergency Medicine | Admitting: Emergency Medicine

## 2016-07-11 ENCOUNTER — Encounter (HOSPITAL_COMMUNITY): Payer: Self-pay | Admitting: Family Medicine

## 2016-07-11 DIAGNOSIS — R21 Rash and other nonspecific skin eruption: Secondary | ICD-10-CM

## 2016-07-11 DIAGNOSIS — Z889 Allergy status to unspecified drugs, medicaments and biological substances status: Secondary | ICD-10-CM | POA: Diagnosis not present

## 2016-07-11 DIAGNOSIS — T7840XA Allergy, unspecified, initial encounter: Secondary | ICD-10-CM

## 2016-07-11 MED ORDER — PREDNISONE 20 MG PO TABS: ORAL_TABLET | ORAL | Status: DC

## 2016-07-11 MED ORDER — CEPHALEXIN 500 MG PO CAPS: 500.0000 mg | ORAL_CAPSULE | Freq: Four times a day (QID) | ORAL | Status: DC

## 2016-07-11 NOTE — ED Provider Notes (Signed)
CSN: [id number]     Arrival date & time 07/11/16  1213 History   First MD Initiated Contact with Patient 07/11/16 1232     Chief Complaint  Patient presents with  . Allergic Reaction   (Consider location/radiation/quality/duration/timing/severity/associated sxs/prior Treatment) HPI Comments: 35 year old female seen in the urgent care on July 14 for UTI. She was treated with Macrobid. Approximately 3 days after initiating the medication she broke out into an itchy papular rash. The majority of the rash involves the face but also involves the extremities and lesser to the trauma. Denies swelling/edema, swelling within the throat or mouth, wheezing, cough, dyspnea or other problems. She states that her urinary symptoms where partially ameliorated by the medications but had not abated. She quit taking the Macrobid shortly after the rash started.  Patient is a 35 y.o. female presenting with allergic reaction.  Allergic Reaction Presenting symptoms: no wheezing     Past Medical History  Diagnosis Date  . No pertinent past medical history    Past Surgical History  Procedure Laterality Date  . Cesarean section     Family History  Problem Relation Age of Onset  . Cancer Mother   . Cancer Father    Social History  Substance Use Topics  . Smoking status: Never Smoker   . Smokeless tobacco: None  . Alcohol Use: Yes     Comment: occasional   OB History    Gravida Para Term Preterm AB TAB SAB Ectopic Multiple Living   1 1 1       1      Review of Systems  Constitutional: Negative.   HENT: Negative.   Respiratory: Negative.  Negative for cough, shortness of breath and wheezing.   Cardiovascular: Negative for chest pain and leg swelling.  Gastrointestinal: Negative.   Genitourinary: Positive for dysuria and frequency.  Musculoskeletal: Negative.   All other systems reviewed and are negative.   Allergies  Review of patient's allergies indicates no known allergies.  Home  Medications   Prior to Admission medications   Medication Sig Start Date End Date Taking? Authorizing Provider  cephALEXin (KEFLEX) 500 MG capsule Take 1 capsule (500 mg total) by mouth 4 (four) times daily. 07/11/16   Hayden Rasmussenavid Lylia Karn, NP  phenazopyridine (PYRIDIUM) 200 MG tablet 1 tablet po tid prn urinary spasms , up to 2 days only 07/05/16   Riki SheerMichelle G Young, PA-C  predniSONE (DELTASONE) 20 MG tablet Take 3 tabs po on first day, 2 tabs second day, 2 tabs third day, 1 tab fourth day, 1 tab 5th day. Take with food. 07/11/16   Hayden Rasmussenavid Berdell Nevitt, NP   Meds Ordered and Administered this Visit  Medications - No data to display  BP 127/83 mmHg  Pulse 96  Temp(Src) 98.6 F (37 C) (Oral)  Resp 16  SpO2 98%  LMP 05/30/2016 No data found.   Physical Exam  Constitutional: She is oriented to person, place, and time. She appears well-developed and well-nourished. No distress.  HENT:  Mouth/Throat: Oropharynx is clear and moist. No oropharyngeal exudate.  No intraoral edema or erythema. Normal swallowing reflex. Airway widely patent.  Eyes: Conjunctivae and EOM are normal.  Neck: Normal range of motion. Neck supple.  Cardiovascular: Normal rate, regular rhythm and normal heart sounds.   Pulmonary/Chest: Effort normal and breath sounds normal. No respiratory distress. She has no wheezes.  Musculoskeletal: She exhibits no edema.  Neurological: She is alert and oriented to person, place, and time. She exhibits normal muscle tone.  Skin:  Skin is warm and dry.  Pruritic flesh-colored papular rash with greatest density to the face and lesser to the extremities and torso.  Psychiatric: She has a normal mood and affect.  Nursing note and vitals reviewed.   ED Course  Procedures (including critical care time)  Labs Review Labs Reviewed - No data to display  Imaging Review No results found.   Visual Acuity Review  Right Eye Distance:   Left Eye Distance:   Bilateral Distance:    Right Eye Near:    Left Eye Near:    Bilateral Near:         MDM   1. Allergic reaction caused by a drug   2. Rash    Continue taking the Benadryl as needed for itching and rash. Start taking the prednisone as directed. Stop the first antibiotic, Macrobid and start taking the Keflex. Continue drinking plenty of water and stay well-hydrated. Meds ordered this encounter  Medications  . cephALEXin (KEFLEX) 500 MG capsule    Sig: Take 1 capsule (500 mg total) by mouth 4 (four) times daily.    Dispense:  28 capsule    Refill:  0    Order Specific Question:  Supervising Provider    Answer:  Charm Rings [id number]  . predniSONE (DELTASONE) 20 MG tablet    Sig: Take 3 tabs po on first day, 2 tabs second day, 2 tabs third day, 1 tab fourth day, 1 tab 5th day. Take with food.    Dispense:  9 tablet    Refill:  0    Order Specific Question:  Supervising Provider    Answer:  Micheline Chapman      Hayden Rasmussen, NP 07/11/16 1259

## 2016-07-11 NOTE — ED Notes (Signed)
Pt here for allergic reaction to medication she was prescribed here for UTI last week. Rash and itching predominately on the face. Taking benadryl for relief.

## 2016-07-11 NOTE — Discharge Instructions (Signed)
Allergies Continue taking the Benadryl as needed for itching and rash. Start taking the prednisone as directed. Stop the first antibiotic, Macrobid and start taking the Keflex. Continue drinking plenty of water and stay well-hydrated. An allergy is when your body reacts to a substance in a way that is not normal. An allergic reaction can happen after you:  Eat something.  Breathe in something.  Touch something. WHAT KINDS OF ALLERGIES ARE THERE? You can be allergic to:  Things that are only around during certain seasons, like molds and pollens.  Foods.  Drugs.  Insects.  Animal dander. WHAT ARE SYMPTOMS OF ALLERGIES?  Puffiness (swelling). This may happen on the lips, face, tongue, mouth, or throat.  Sneezing.  Coughing.  Breathing loudly (wheezing).  Stuffy nose.  Tingling in the mouth.  A rash.  Itching.  Itchy, red, puffy areas of skin (hives).  Watery eyes.  Throwing up (vomiting).  Watery poop (diarrhea).  Dizziness.  Feeling faint or fainting.  Trouble breathing or swallowing.  A tight feeling in the chest.  A fast heartbeat. HOW ARE ALLERGIES DIAGNOSED? Allergies can be diagnosed with:  A medical and family history.  Skin tests.  Blood tests.  A food diary. A food diary is a record of all the foods, drinks, and symptoms you have each day.  The results of an elimination diet. This diet involves making sure not to eat certain foods and then seeing what happens when you start eating them again. HOW ARE ALLERGIES TREATED? There is no cure for allergies, but allergic reactions can be treated with medicine. Severe reactions usually need to be treated at a hospital.  HOW CAN REACTIONS BE PREVENTED? The best way to prevent an allergic reaction is to avoid the thing you are allergic to. Allergy shots and medicines can also help prevent reactions in some cases.   This information is not intended to replace advice given to you by your health care  provider. Make sure you discuss any questions you have with your health care provider.   Document Released: 04/05/2013 Document Revised: 12/30/2014 Document Reviewed: 09/20/2014 Elsevier Interactive Patient Education [employer]2016 Elsevier Inc.

## 2016-10-29 ENCOUNTER — Encounter: Payer: Self-pay | Admitting: Emergency Medicine

## 2016-10-29 ENCOUNTER — Emergency Department
Admission: EM | Admit: 2016-10-29 | Discharge: 2016-10-29 | Disposition: A | Discharge status: Home / Self Care | Payer: Self-pay | Attending: Emergency Medicine | Admitting: Emergency Medicine

## 2016-10-29 ENCOUNTER — Emergency Department: Payer: Self-pay

## 2016-10-29 DIAGNOSIS — M436 Torticollis: Secondary | ICD-10-CM | POA: Insufficient documentation

## 2016-10-29 MED ORDER — IBUPROFEN 600 MG PO TABS: 600.0000 mg | ORAL_TABLET | Freq: Three times a day (TID) | ORAL | 0 refills | Status: DC | PRN

## 2016-10-29 MED ORDER — METHOCARBAMOL 750 MG PO TABS: 750.0000 mg | ORAL_TABLET | Freq: Four times a day (QID) | ORAL | 0 refills | Status: DC

## 2016-10-29 MED ORDER — TRAMADOL HCL 50 MG PO TABS: 50.0000 mg | ORAL_TABLET | Freq: Four times a day (QID) | ORAL | 0 refills | Status: AC | PRN

## 2016-10-29 NOTE — ED Notes (Signed)
States she woke up a stiff neck on Thursday  Developed right sided headache on Friday   Has tried OTC meds w/o relief.also had some blurred vision and some confusion intermittently  Denies any trauma fever or n/v  Ambulates with steady gait  No slurred speech noted

## 2016-10-29 NOTE — ED Provider Notes (Signed)
[facility] Emergency Department Provider Note   ____________________________________________   None    (approximate)  I have reviewed the triage vital signs and the nursing notes.   HISTORY  Chief Complaint Torticollis    HPI [name] is a 35 y.o. female patient states 5 days ago she went with a stiff neck. Patient states he developed right occipital headache the next day. Patient stated no relief with over-the-counter medications. Patient state she is having intermittent blurry vision and confusion. Patient denies any trauma. Patient denies any fever associated with this complaint. Patient stated pain increases with flexion of the neck. Patient stated the pain worsens overnight she rates the pain as a 10 over 10. Patient states the worst headache she ever head.   Past Medical History:  Diagnosis Date  . No pertinent past medical history     There are no active problems to display for this patient.   Past Surgical History:  Procedure Laterality Date  . CESAREAN SECTION      Prior to Admission medications   Medication Sig Start Date End Date Taking? Authorizing Provider  ibuprofen (ADVIL,MOTRIN) 600 MG tablet Take 1 tablet (600 mg total) by mouth every 8 (eight) hours as needed. 10/29/16   Joni Reiningonald K Kahlea Cobert, PA-C  methocarbamol (ROBAXIN-750) 750 MG tablet Take 1 tablet (750 mg total) by mouth 4 (four) times daily. 10/29/16   Joni Reiningonald K Karyl Sharrar, PA-C  traMADol (ULTRAM) 50 MG tablet Take 1 tablet (50 mg total) by mouth every 6 (six) hours as needed. 10/29/16 10/29/17  Joni Reiningonald K Apolonio Cutting, PA-C    Allergies Patient has no known allergies.  Family History  Problem Relation Age of Onset  . Cancer Mother   . Cancer Father     Social History Social History  Substance Use Topics  . Smoking status: Never Smoker  . Smokeless tobacco: Never Used  . Alcohol use Yes     Comment: occasional    Review of Systems Constitutional: No  fever/chills Eyes:Intermittent blurry vision. ENT: No sore throat. Cardiovascular: Denies chest pain. Respiratory: Denies shortness of breath. Gastrointestinal: No abdominal pain.  No nausea, no vomiting.  No diarrhea.  No constipation. Genitourinary: Negative for dysuria. Musculoskeletal: Negative for back pain. Skin: Negative for rash. Neurological: Positive for occipital headaches, intermittent confusion. Radicular neck pain    ____________________________________________   PHYSICAL EXAM:  VITAL SIGNS: ED Triage Vitals  Enc Vitals Group     BP 10/29/16 0701 120/73     Pulse Rate 10/29/16 0701 89     Resp 10/29/16 0701 20     Temp 10/29/16 0701 98.5 F (36.9 C)     Temp Source 10/29/16 0701 Oral     SpO2 10/29/16 0701 99 %     Weight 10/29/16 0659 266 lb (120.7 kg)     Height 10/29/16 0659 5\' 7"  (1.702 m)     Head Circumference --      Peak Flow --      Pain Score 10/29/16 0659 10     Pain Loc --      Pain Edu? --      Excl. in GC? --     Constitutional: Alert and oriented. Well appearing and in no acute distress. Overweight Eyes: Conjunctivae are normal. PERRL. EOMI. Head: Atraumatic. Nose: No congestion/rhinnorhea. Mouth/Throat: Mucous membranes are moist.  Oropharynx non-erythematous. Neck: No stridor.  No cervical spine tenderness to palpation. Decreased range of motion with flexion and left lateral movements. Hematological/Lymphatic/Immunilogical: No cervical lymphadenopathy.  Cardiovascular: Normal rate, regular rhythm. Grossly normal heart sounds.  Good peripheral circulation. Respiratory: Normal respiratory effort.  No retractions. Lungs CTAB. Gastrointestinal: Soft and nontender. No distention. No abdominal bruits. No CVA tenderness. Musculoskeletal: No lower extremity tenderness nor edema.  No joint effusions. Neurologic:  Normal speech and language. No gross focal neurologic deficits are appreciated. No gait instability. Skin:  Skin is warm, dry and  intact. No rash noted. Psychiatric: Mood and affect are normal. Speech and behavior are normal.  ____________________________________________   LABS (all labs ordered are listed, but only abnormal results are displayed)  Labs Reviewed - No data to display ____________________________________________  EKG   ____________________________________________  RADIOLOGY  No acute findings on CT scan of the head and neck. ____________________________________________   PROCEDURES  Procedure(s) performed: None  Procedures  Critical Care performed: No  ____________________________________________   INITIAL IMPRESSION / ASSESSMENT AND PLAN / ED COURSE  Pertinent labs & imaging results that were available during my care of the patient were reviewed by me and considered in my medical decision making (see chart for details).  Torticollis. Patient given discharge care instructions patient. Patient given prescription for for Robaxin, tramadol, and ibuprofen. Patient given a work note for 2 days. Patient advised to follow-up family doctor if condition persists.  Clinical Course      ____________________________________________   FINAL CLINICAL IMPRESSION(S) / ED DIAGNOSES  Final diagnoses:  Torticollis      NEW MEDICATIONS STARTED DURING THIS VISIT:  New Prescriptions   IBUPROFEN (ADVIL,MOTRIN) 600 MG TABLET    Take 1 tablet (600 mg total) by mouth every 8 (eight) hours as needed.   METHOCARBAMOL (ROBAXIN-750) 750 MG TABLET    Take 1 tablet (750 mg total) by mouth 4 (four) times daily.   TRAMADOL (ULTRAM) 50 MG TABLET    Take 1 tablet (50 mg total) by mouth every 6 (six) hours as needed.     Note:  This document was prepared using Dragon voice recognition software and may include unintentional dictation errors.    Joni Reiningonald K Goldye Tourangeau, PA-C 10/29/16 [id number]    Jene Everyobert Kinner, MD 10/29/16 [phone]

## 2016-10-29 NOTE — ED Triage Notes (Signed)
Patient ambulatory to triage with steady gait, without difficulty or distress noted; pt reports right sided neck pain x 4 days that increases with movement; denies any injury

## 2017-06-06 ENCOUNTER — Encounter: Payer: Self-pay | Admitting: Medical Oncology

## 2017-06-06 ENCOUNTER — Emergency Department
Admission: EM | Admit: 2017-06-06 | Discharge: 2017-06-06 | Disposition: A | Discharge status: Home / Self Care | Payer: Self-pay | Attending: Emergency Medicine | Admitting: Emergency Medicine

## 2017-06-06 ENCOUNTER — Emergency Department: Payer: Self-pay

## 2017-06-06 DIAGNOSIS — S39012A Strain of muscle, fascia and tendon of lower back, initial encounter: Secondary | ICD-10-CM | POA: Insufficient documentation

## 2017-06-06 DIAGNOSIS — Y92810 Car as the place of occurrence of the external cause: Secondary | ICD-10-CM | POA: Insufficient documentation

## 2017-06-06 DIAGNOSIS — Y9389 Activity, other specified: Secondary | ICD-10-CM | POA: Insufficient documentation

## 2017-06-06 DIAGNOSIS — X501XXA Overexertion from prolonged static or awkward postures, initial encounter: Secondary | ICD-10-CM | POA: Insufficient documentation

## 2017-06-06 DIAGNOSIS — Z79899 Other long term (current) drug therapy: Secondary | ICD-10-CM | POA: Insufficient documentation

## 2017-06-06 DIAGNOSIS — Y999 Unspecified external cause status: Secondary | ICD-10-CM | POA: Insufficient documentation

## 2017-06-06 DIAGNOSIS — M545 Low back pain: Secondary | ICD-10-CM | POA: Diagnosis not present

## 2017-06-06 LAB — POCT PREGNANCY, URINE: Preg Test, Ur: NEGATIVE

## 2017-06-06 MED ORDER — METHOCARBAMOL 750 MG PO TABS: 1500.0000 mg | ORAL_TABLET | Freq: Four times a day (QID) | ORAL | 0 refills | Status: DC

## 2017-06-06 MED ORDER — OXYCODONE-ACETAMINOPHEN 7.5-325 MG PO TABS
1.0000 | ORAL_TABLET | Freq: Four times a day (QID) | ORAL | 0 refills | Status: DC | PRN
Start: 2017-06-06 — End: 2018-06-26

## 2017-06-06 MED ORDER — METHYLPREDNISOLONE 4 MG PO TBPK: ORAL_TABLET | ORAL | 0 refills | Status: DC

## 2017-06-06 NOTE — ED Triage Notes (Signed)
Pt reports rt sided lower back/buttock pain that runs down rt leg. Pt denies injury has had pain since feb.

## 2017-06-06 NOTE — ED Provider Notes (Signed)
[facility] Emergency Department Provider Note   ____________________________________________   First MD Initiated Contact with Patient 06/06/17 [phone]     (approximate)  I have reviewed the triage vital signs and the nursing notes.   HISTORY  Chief Complaint Back Pain    HPI [name] is a 36 y.o. female patient complaining of radicular low back pain to the right lower extremity for 4 months. Patient denies any provocative incident except for repetitive heavy lifting at work. Patient denies bladder or bowel dysfunction. Patient stated no improvement with physical therapy in 2 months.Patient states pain increases with prolonged sitting because she just travoprost 45 minutes to work. Patient also pain increases with flexion and extension of her back. Patient rates the pain as 8/10. No other palliative measures for complaint.   Past Medical History:  Diagnosis Date  . No pertinent past medical history     There are no active problems to display for this patient.   Past Surgical History:  Procedure Laterality Date  . CESAREAN SECTION      Prior to Admission medications   Medication Sig Start Date End Date Taking? Authorizing Provider  ibuprofen (ADVIL,MOTRIN) 600 MG tablet Take 1 tablet (600 mg total) by mouth every 8 (eight) hours as needed. 10/29/16   Joni Reining, PA-C  methocarbamol (ROBAXIN-750) 750 MG tablet Take 1 tablet (750 mg total) by mouth 4 (four) times daily. 10/29/16   Joni Reining, PA-C  methocarbamol (ROBAXIN-750) 750 MG tablet Take 2 tablets (1,500 mg total) by mouth 4 (four) times daily. 06/06/17   Joni Reining, PA-C  methylPREDNISolone (MEDROL DOSEPAK) 4 MG TBPK tablet Take Tapered dose as directed 06/06/17   Joni Reining, PA-C  oxyCODONE-acetaminophen (PERCOCET) 7.5-325 MG tablet Take 1 tablet by mouth every 6 (six) hours as needed for severe pain. 06/06/17   Joni Reining, PA-C  traMADol (ULTRAM) 50 MG tablet Take  1 tablet (50 mg total) by mouth every 6 (six) hours as needed. 10/29/16 10/29/17  Joni Reining, PA-C    Allergies Patient has no known allergies.  Family History  Problem Relation Age of Onset  . Cancer Mother   . Cancer Father     Social History Social History  Substance Use Topics  . Smoking status: Never Smoker  . Smokeless tobacco: Never Used  . Alcohol use Yes     Comment: occasional    Review of Systems  Constitutional: No fever/chills Eyes: No visual changes. ENT: No sore throat. Cardiovascular: Denies chest pain. Respiratory: Denies shortness of breath. Gastrointestinal: No abdominal pain.  No nausea, no vomiting.  No diarrhea.  No constipation. Genitourinary: Negative for dysuria. Musculoskeletal: Positive for back pain. Skin: Negative for rash. Neurological: Negative for headaches, focal weakness or numbness.   ____________________________________________   PHYSICAL EXAM:  VITAL SIGNS: ED Triage Vitals   Enc Vitals Group     BP 133/63     Pulse Rate 94     Resp 18     Temp 98.6 F (37 C)     Temp Source Oral     SpO2 98 %     Weight 260 lb (117.9 kg)     Height 5\' 8"  (1.727 m)     Head Circumference      Peak Flow      Pain Score 8     Pain Loc      Pain Edu?      Excl. in GC?  Constitutional: Alert and oriented. Well appearing and in no acute distress. Overweight Neck: No stridor.  No cervical spine tenderness to palpation. Hematological/Lymphatic/Immunilogical: No cervical lymphadenopathy. Cardiovascular: Normal rate, regular rhythm. Grossly normal heart sounds.  Good peripheral circulation. Respiratory: Normal respiratory effort.  No retractions. Lungs CTAB. Gastrointestinal: Soft and nontender. No distention. No abdominal bruits. No CVA tenderness. Musculoskeletal: No obvious spinal deformity. Neurologic:  Normal speech and language. No gross focal neurologic deficits are appreciated. No gait instability. Skin:  Skin is warm, dry  and intact. No rash noted. Psychiatric: Mood and affect are normal. Speech and behavior are normal.  ____________________________________________   LABS (all labs ordered are listed, but only abnormal results are displayed)  Labs Reviewed  POC URINE PREG, ED  POCT PREGNANCY, URINE   ____________________________________________  EKG   ____________________________________________  RADIOLOGY  Dg Lumbar Spine Complete  Result Date: 06/06/2017 CLINICAL DATA:  Lower back pain radiating into the right leg, no known injury, initial encounter EXAM: LUMBAR SPINE - COMPLETE 4+ VIEW COMPARISON:  None. FINDINGS: Five lumbar type vertebral bodies are well visualized. Vertebral body height is well maintained. No pars defects are seen. No disc space narrowing is noted. No soft tissue abnormality is seen. IMPRESSION: No acute abnormality noted. Electronically Signed   By: Alcide CleverMark  Lukens M.D.   On: 06/06/2017 09:08   No acute findings x-ray of the lumbar spine ____________________________________________   PROCEDURES  Procedure(s) performed: None  Procedures  Critical Care performed: No  ____________________________________________   INITIAL IMPRESSION / ASSESSMENT AND PLAN / ED COURSE  Pertinent labs & imaging results that were available during my care of the patient were reviewed by me and considered in my medical decision making (see chart for details).  Acute lumbar strain. Discussed x-ray finding with patient. Patient given discharge care instructions and a work note. Patient advised to follow-up with her treating physical therapist as needed.      ____________________________________________   FINAL CLINICAL IMPRESSION(S) / ED DIAGNOSES  Final diagnoses:  Strain of lumbar region, initial encounter      NEW MEDICATIONS STARTED DURING THIS VISIT:  New Prescriptions   METHOCARBAMOL (ROBAXIN-750) 750 MG TABLET    Take 2 tablets (1,500 mg total) by mouth 4 (four) times  daily.   METHYLPREDNISOLONE (MEDROL DOSEPAK) 4 MG TBPK TABLET    Take Tapered dose as directed   OXYCODONE-ACETAMINOPHEN (PERCOCET) 7.5-325 MG TABLET    Take 1 tablet by mouth every 6 (six) hours as needed for severe pain.     Note:  This document was prepared using Dragon voice recognition software and may include unintentional dictation errors.    Joni ReiningSmith, Ronald K, PA-C 06/06/17 [id number]    Jene EveryKinner, Robert, MD 06/06/17 [phone]

## 2017-07-03 ENCOUNTER — Emergency Department
Admission: EM | Admit: 2017-07-03 | Discharge: 2017-07-03 | Disposition: A | Discharge status: Home / Self Care | Payer: BLUE CROSS/BLUE SHIELD | Attending: Emergency Medicine | Admitting: Emergency Medicine

## 2017-07-03 DIAGNOSIS — M5431 Sciatica, right side: Secondary | ICD-10-CM

## 2017-07-03 DIAGNOSIS — Z79899 Other long term (current) drug therapy: Secondary | ICD-10-CM | POA: Insufficient documentation

## 2017-07-03 MED ORDER — METHYLPREDNISOLONE 4 MG PO TBPK: ORAL_TABLET | ORAL | 0 refills | Status: DC

## 2017-07-03 MED ORDER — METHYLPREDNISOLONE SODIUM SUCC 125 MG IJ SOLR
125.0000 mg | Freq: Once | INTRAMUSCULAR | Status: AC
  Administered 2017-07-03: 125 mg via INTRAMUSCULAR
  Filled 2017-07-03: qty 2

## 2017-07-03 MED ORDER — METHOCARBAMOL 750 MG PO TABS: 1500.0000 mg | ORAL_TABLET | Freq: Four times a day (QID) | ORAL | 0 refills | Status: DC

## 2017-07-03 NOTE — ED Triage Notes (Signed)
Right sided back and hip pain that runs down right leg. Pt reports numbness to foot, color and pulse WNL. Pt alert and oriented X4, active, cooperative, pt in NAD. RR even and unlabored, color WNL.

## 2017-07-03 NOTE — ED Notes (Signed)
See triage note  Presents with pain to right hip and moving into leg.  Denies any injury  Hx of same last month  States she is having numbness to foot   Positive pulses noted

## 2017-07-03 NOTE — ED Provider Notes (Signed)
[facility] Emergency Department Provider Note   ____________________________________________   None    (approximate)  I have reviewed the triage vital signs and the nursing notes.   HISTORY  Chief Complaint Leg Pain and Hip Pain    HPI [name] is a 36 y.o. female patient complain of 4 months of right hip pain that radiates down her leg. Patient state her foot is numb. Patient stated no relief with physical therapy and 2 ER visits. Patient denies any bladder or bowel dysfunction.Patient rates pain as a 10 over 10. Patient stated no relief with sterile medications muscle relaxers or narcotics. Patient is requesting a referral to any specialist they can evaluate and treat her complaint.   Past Medical History:  Diagnosis Date  . No pertinent past medical history     There are no active problems to display for this patient.   Past Surgical History:  Procedure Laterality Date  . CESAREAN SECTION      Prior to Admission medications   Medication Sig Start Date End Date Taking? Authorizing Provider  ibuprofen (ADVIL,MOTRIN) 600 MG tablet Take 1 tablet (600 mg total) by mouth every 8 (eight) hours as needed. 10/29/16   Joni Reining, PA-C  methocarbamol (ROBAXIN-750) 750 MG tablet Take 1 tablet (750 mg total) by mouth 4 (four) times daily. 10/29/16   Joni Reining, PA-C  methocarbamol (ROBAXIN-750) 750 MG tablet Take 2 tablets (1,500 mg total) by mouth 4 (four) times daily. 06/06/17   Joni Reining, PA-C  methocarbamol (ROBAXIN-750) 750 MG tablet Take 2 tablets (1,500 mg total) by mouth 4 (four) times daily. 07/03/17   Joni Reining, PA-C  methylPREDNISolone (MEDROL DOSEPAK) 4 MG TBPK tablet Take Tapered dose as directed 06/06/17   Joni Reining, PA-C  methylPREDNISolone (MEDROL DOSEPAK) 4 MG TBPK tablet Take Tapered dose as directed 07/03/17   Joni Reining, PA-C  oxyCODONE-acetaminophen (PERCOCET) 7.5-325 MG tablet Take 1 tablet by mouth  every 6 (six) hours as needed for severe pain. 06/06/17   Joni Reining, PA-C  traMADol (ULTRAM) 50 MG tablet Take 1 tablet (50 mg total) by mouth every 6 (six) hours as needed. 10/29/16 10/29/17  Joni Reining, PA-C    Allergies Patient has no known allergies.  Family History  Problem Relation Age of Onset  . Cancer Mother   . Cancer Father     Social History Social History  Substance Use Topics  . Smoking status: Never Smoker  . Smokeless tobacco: Never Used  . Alcohol use Yes     Comment: occasional    Review of Systems  Constitutional: No fever/chills Eyes: No visual changes. ENT: No sore throat. Cardiovascular: Denies chest pain. Respiratory: Denies shortness of breath. Gastrointestinal: No abdominal pain.  No nausea, no vomiting.  No diarrhea.  No constipation. Genitourinary: Negative for dysuria. Musculoskeletal: Negative for back pain. Skin: Negative for rash. Neurological: Negative for headaches, focal weakness or numbness.   ____________________________________________   PHYSICAL EXAM:  VITAL SIGNS: ED Triage Vitals [07/03/17 0914]  Enc Vitals Group     BP 137/87     Pulse Rate 100     Resp 18     Temp 99 F (37.2 C)     Temp Source Oral     SpO2 100 %     Weight 255 lb (115.7 kg)     Height 5\' 8"  (1.727 m)     Head Circumference      Peak Flow  Pain Score 10     Pain Loc      Pain Edu?      Excl. in GC?     Constitutional: Alert and oriented. Well appearing and in no acute distress. Tearful and anxious  Cardiovascular: Normal rate, regular rhythm. Grossly normal heart sounds.  Good peripheral circulation. Respiratory: Normal respiratory effort.  No retractions. Lungs CTAB. Gastrointestinal: Soft and nontender. No distention. No abdominal bruits. No CVA tenderness. Musculoskeletal: No obvious lumbar spinal deformity. Patient has moderate guarding palpation of elbow. S1. Patient has full nuchal range of motion of the right lower  extremity. Patient's next day leg test.  Neurologic:  Normal speech and language. No gross focal neurologic deficits are appreciated. No gait instability. Skin:  Skin is warm, dry and intact. No rash noted. Psychiatric: Mood and affect are normal. Speech and behavior are normal.  ____________________________________________   LABS (all labs ordered are listed, but only abnormal results are displayed)  Labs Reviewed - No data to display ____________________________________________  EKG   ____________________________________________  RADIOLOGY  No results found.  __Reviewed x-ray taken last month of the lumbar spine show no acute abnormalities. __________________________________________   PROCEDURES  Procedure(s) performed: None  Procedures  Critical Care performed: No  ____________________________________________   INITIAL IMPRESSION / ASSESSMENT AND PLAN / ED COURSE  Pertinent labs & imaging results that were available during my care of the patient were reviewed by me and considered in my medical decision making (see chart for details).  Radicular back pain to right lower extremity. Patient given discharge care instructions. Patient given referral to neurology. Patient advised to follow-up by calling for an appointment with neurologist.      ____________________________________________   FINAL CLINICAL IMPRESSION(S) / ED DIAGNOSES  Final diagnoses:  Sciatica of right side      NEW MEDICATIONS STARTED DURING THIS VISIT:  New Prescriptions   METHOCARBAMOL (ROBAXIN-750) 750 MG TABLET    Take 2 tablets (1,500 mg total) by mouth 4 (four) times daily.   METHYLPREDNISOLONE (MEDROL DOSEPAK) 4 MG TBPK TABLET    Take Tapered dose as directed     Note:  This document was prepared using Dragon voice recognition software and may include unintentional dictation errors.    Joni ReiningSmith, Ace Bergfeld K, PA-C 07/03/17 1015    Minna AntisPaduchowski, Kevin, MD 07/03/17 [phone]

## 2018-06-26 ENCOUNTER — Emergency Department
Admission: EM | Admit: 2018-06-26 | Discharge: 2018-06-26 | Disposition: A | Discharge status: Home / Self Care | Payer: BLUE CROSS/BLUE SHIELD | Attending: Emergency Medicine | Admitting: Emergency Medicine

## 2018-06-26 ENCOUNTER — Other Ambulatory Visit: Payer: Self-pay

## 2018-06-26 ENCOUNTER — Encounter: Payer: Self-pay | Admitting: Emergency Medicine

## 2018-06-26 DIAGNOSIS — R102 Pelvic and perineal pain: Secondary | ICD-10-CM | POA: Diagnosis not present

## 2018-06-26 DIAGNOSIS — D509 Iron deficiency anemia, unspecified: Secondary | ICD-10-CM | POA: Diagnosis not present

## 2018-06-26 DIAGNOSIS — Z79899 Other long term (current) drug therapy: Secondary | ICD-10-CM | POA: Insufficient documentation

## 2018-06-26 DIAGNOSIS — N912 Amenorrhea, unspecified: Secondary | ICD-10-CM | POA: Insufficient documentation

## 2018-06-26 LAB — URINALYSIS, COMPLETE (UACMP) WITH MICROSCOPIC
Bacteria, UA: NONE SEEN
Bilirubin Urine: NEGATIVE
Glucose, UA: NEGATIVE mg/dL
Ketones, ur: NEGATIVE mg/dL
Leukocytes, UA: NEGATIVE
Nitrite: NEGATIVE
Protein, ur: NEGATIVE mg/dL
Specific Gravity, Urine: 1.017 (ref 1.005–1.030)
pH: 6 (ref 5.0–8.0)

## 2018-06-26 LAB — CBC
HCT: 33.4 % — ABNORMAL LOW (ref 35.0–47.0)
Hemoglobin: 10.4 g/dL — ABNORMAL LOW (ref 12.0–16.0)
MCH: 19.3 pg — ABNORMAL LOW (ref 26.0–34.0)
MCHC: 31.2 g/dL — ABNORMAL LOW (ref 32.0–36.0)
MCV: 61.7 fL — ABNORMAL LOW (ref 80.0–100.0)
Platelets: 335 10*3/uL (ref 150–440)
RBC: 5.42 MIL/uL — ABNORMAL HIGH (ref 3.80–5.20)
RDW: 19.4 % — ABNORMAL HIGH (ref 11.5–14.5)
WBC: 11.9 10*3/uL — ABNORMAL HIGH (ref 3.6–11.0)

## 2018-06-26 LAB — COMPREHENSIVE METABOLIC PANEL
ALT: 12 U/L (ref 0–44)
AST: 16 U/L (ref 15–41)
Albumin: 3.8 g/dL (ref 3.5–5.0)
Alkaline Phosphatase: 89 U/L (ref 38–126)
Anion gap: 11 (ref 5–15)
BUN: 11 mg/dL (ref 6–20)
CO2: 24 mmol/L (ref 22–32)
Calcium: 9 mg/dL (ref 8.9–10.3)
Chloride: 103 mmol/L (ref 98–111)
Creatinine, Ser: 0.64 mg/dL (ref 0.44–1.00)
GFR calc Af Amer: >60 mL/min (ref >60)
GFR calc non Af Amer: >60 mL/min (ref >60)
Glucose, Bld: 107 mg/dL — ABNORMAL HIGH (ref 70–99)
Potassium: 3.7 mmol/L (ref 3.5–5.1)
Sodium: 138 mmol/L (ref 135–145)
Total Bilirubin: 0.6 mg/dL (ref 0.3–1.2)
Total Protein: 7.9 g/dL (ref 6.5–8.1)

## 2018-06-26 LAB — LIPASE, BLOOD: Lipase: 35 U/L (ref 11–51)

## 2018-06-26 LAB — POCT PREGNANCY, URINE: Preg Test, Ur: NEGATIVE

## 2018-06-26 LAB — HCG, QUANTITATIVE, PREGNANCY: hCG, Beta Chain, Quant, S: <1 m[IU]/mL (ref <5)

## 2018-06-26 MED ORDER — FERROUS SULFATE 325 (65 FE) MG PO TABS: 325.0000 mg | ORAL_TABLET | Freq: Every day | ORAL | 1 refills | Status: DC

## 2018-06-26 NOTE — ED Provider Notes (Signed)
[facility] Emergency Department Provider Note  ____________________________________________  Time seen: Approximately 11:04 AM  I have reviewed the triage vital signs and the nursing notes.   HISTORY  Chief Complaint Abdominal Pain   HPI [name] is a 37 y.o. female with no significant past medical history who presents for evaluation of irregular menstrual..  Patient reports that her LMP was May 1.  On May 14 she took the Plan B pill.  Since then patient has not had a menstrual period.  She has had intermittent lower cramping abdominal pain radiating to her back which resembles her menstrual cramps over the last several weeks.  These episodes usually happen at nighttime and resolve without any intervention.  She reports that the last day she had abdominal pain was several days ago.  She has no pain at this time.  Also for the last week she has been having nausea and usually one episode of nonbloody nonbilious emesis every other day.  The nausea usually resolves after that.  She is able to eat and drink.  She denies vaginal discharge, dysuria, hematuria, fever, chills, chest pain or shortness of breath, URI symptoms.  She has taken several urine pregnancy test at home which are all negative.  Today she called her primary care doctor and was instructed to come to the emergency room for evaluation.  Patient reports one prior episode of abnormal/irregular periods several years ago but she never seek medical care  Past Medical History:  Diagnosis Date  . No pertinent past medical history     Past Surgical History:  Procedure Laterality Date  . CESAREAN SECTION      Prior to Admission medications   Medication Sig Start Date End Date Taking? Authorizing Provider  vitamin B-12 (CYANOCOBALAMIN) 1000 MCG tablet Take 1,000 mcg by mouth daily.   Yes [provider]  ferrous sulfate 325 (65 FE) MG tablet Take 1 tablet (325 mg total) by mouth daily with  breakfast. 06/26/18   Nita Sickle, MD    Allergies Patient has no known allergies.  Family History  Problem Relation Age of Onset  . Cancer Mother   . Cancer Father     Social History Social History   Tobacco Use  . Smoking status: Never Smoker  . Smokeless tobacco: Never Used  Substance Use Topics  . Alcohol use: Yes    Comment: occasional  . Drug use: No    Review of Systems  Constitutional: Negative for fever. Eyes: Negative for visual changes. ENT: Negative for sore throat. Neck: No neck pain  Cardiovascular: Negative for chest pain. Respiratory: Negative for shortness of breath. Gastrointestinal: + abdominal pain, nausea, and vomiting. No diarrhea. Genitourinary: Negative for dysuria. + irregular menstrual cycle Musculoskeletal: Negative for back pain. Skin: Negative for rash. Neurological: Negative for headaches, weakness or numbness. Psych: No SI or HI  ____________________________________________   PHYSICAL EXAM:  VITAL SIGNS: ED Triage Vitals  Enc Vitals Group     BP 06/26/18 1001 123/63     Pulse Rate 06/26/18 1001 95     Resp 06/26/18 1001 18     Temp 06/26/18 1001 99 F (37.2 C)     Temp Source 06/26/18 1001 Oral     SpO2 06/26/18 1001 100 %     Weight 06/26/18 1002 260 lb (117.9 kg)     Height 06/26/18 1002 5\' 7"  (1.702 m)     Head Circumference --      Peak Flow --  Pain Score 06/26/18 1011 1     Pain Loc --      Pain Edu? --      Excl. in GC? --     Constitutional: Alert and oriented. Well appearing and in no apparent distress. HEENT:      Head: Normocephalic and atraumatic.         Eyes: Conjunctivae are normal. Sclera is non-icteric.       Mouth/Throat: Mucous membranes are moist.       Neck: Supple with no signs of meningismus. Cardiovascular: Regular rate and rhythm. No murmurs, gallops, or rubs. 2+ symmetrical distal pulses are present in all extremities. No JVD. Respiratory: Normal respiratory effort. Lungs are clear  to auscultation bilaterally. No wheezes, crackles, or rhonchi.  Gastrointestinal: Soft, non tender, and non distended with positive bowel sounds. No rebound or guarding. Genitourinary: No CVA tenderness. Musculoskeletal: Nontender with normal range of motion in all extremities. No edema, cyanosis, or erythema of extremities. Neurologic: Normal speech and language. Face is symmetric. Moving all extremities. No gross focal neurologic deficits are appreciated. Skin: Skin is warm, dry and intact. No rash noted. Psychiatric: Mood and affect are normal. Speech and behavior are normal.  ____________________________________________   LABS (all labs ordered are listed, but only abnormal results are displayed)  Labs Reviewed  COMPREHENSIVE METABOLIC PANEL - Abnormal; Notable for the following components:      Result Value   Glucose, Bld 107 (*)    All other components within normal limits  CBC - Abnormal; Notable for the following components:   WBC 11.9 (*)    RBC 5.42 (*)    Hemoglobin 10.4 (*)    HCT 33.4 (*)    MCV 61.7 (*)    MCH 19.3 (*)    MCHC 31.2 (*)    RDW 19.4 (*)    All other components within normal limits  URINALYSIS, COMPLETE (UACMP) WITH MICROSCOPIC - Abnormal; Notable for the following components:   Color, Urine YELLOW (*)    APPearance CLEAR (*)    Hgb urine dipstick SMALL (*)    All other components within normal limits  LIPASE, BLOOD  HCG, QUANTITATIVE, PREGNANCY  POCT PREGNANCY, URINE   ____________________________________________  EKG  none  ____________________________________________  RADIOLOGY  none  ____________________________________________   PROCEDURES  Procedure(s) performed: None Procedures Critical Care performed:  None ____________________________________________   INITIAL IMPRESSION / ASSESSMENT AND PLAN / ED COURSE   37 y.o. female with no significant past medical history who presents for evaluation of absent menstrual cycle x 2  months since taking plan B pill on 5/14. Also intermittent lower cramping abdominal pain for several weeks and one week of nausea and occasional vomiting.  Patient is well-appearing, no distress, vital signs are within normal limits, abdomen is completely soft with no tenderness throughout.  hCG is pending.  Urine pregnancy is negative.  Urinalysis with no evidence of infection.  CBC is consistent with iron deficiency anemia.  Patient is hemodynamically stable with no indication for transfusion and no active bleeding.  Will start patient on iron.  If hCG is negative will refer patient to OB/GYN for evaluation of irregular menstrual cycles.  No indication for imaging at this time with no pain for greater than 48 hours and no tenderness on abdominal exam.  If hCG is positive we will send patient for an ultrasound to rule out ectopic pregnancy.   ED COURSE: hCG negative.  Patient remained without any pain or tenderness on exam.  She  was referred to OB/GYN.  Discussed return precautions for new abdominal pain.  Patient was also provided with a prescription for iron p.o. for anemia.   As part of my medical decision making, I reviewed the following data within the electronic MEDICAL RECORD NUMBER Nursing notes reviewed and incorporated, Labs reviewed , Old chart reviewed, Notes from prior ED visits and [place] Controlled Substance Database    Pertinent labs & imaging results that were available during my care of the patient were reviewed by me and considered in my medical decision making (see chart for details).    ____________________________________________   FINAL CLINICAL IMPRESSION(S) / ED DIAGNOSES  Final diagnoses:  Amenorrhea  Iron deficiency anemia, unspecified iron deficiency anemia type      NEW MEDICATIONS STARTED DURING THIS VISIT:  ED Discharge Orders        Ordered    ferrous sulfate 325 (65 FE) MG tablet  Daily with breakfast     06/26/18 1153       Note:  This document was  prepared using Dragon voice recognition software and may include unintentional dictation errors.    Don PerkingVeronese, [place], MD 06/26/18 [phone]

## 2018-06-26 NOTE — ED Triage Notes (Signed)
Patient presents to the ED with lower abdominal cramping x 2 weeks with lower back pain.  Patient reports no period for the past 2 months.  Patient states she has taken several pregnancy tests that were all negative. Denies dysuria and urinary frequency.  Patient reports nausea and vomiting x 1.5 weeks. Patient vomited x 1 yesterday.   Patient is in no obvious distress at this time.

## 2018-07-07 DIAGNOSIS — J019 Acute sinusitis, unspecified: Secondary | ICD-10-CM | POA: Diagnosis not present

## 2019-03-13 DIAGNOSIS — Z113 Encounter for screening for infections with a predominantly sexual mode of transmission: Secondary | ICD-10-CM | POA: Diagnosis not present

## 2019-03-13 DIAGNOSIS — Z114 Encounter for screening for human immunodeficiency virus [HIV]: Secondary | ICD-10-CM | POA: Diagnosis not present

## 2019-04-26 ENCOUNTER — Other Ambulatory Visit: Payer: Self-pay | Admitting: Obstetrics and Gynecology

## 2019-04-27 DIAGNOSIS — Z01419 Encounter for gynecological examination (general) (routine) without abnormal findings: Secondary | ICD-10-CM | POA: Diagnosis not present

## 2019-04-27 DIAGNOSIS — Z124 Encounter for screening for malignant neoplasm of cervix: Secondary | ICD-10-CM | POA: Diagnosis not present

## 2019-04-27 DIAGNOSIS — Z1151 Encounter for screening for human papillomavirus (HPV): Secondary | ICD-10-CM | POA: Diagnosis not present

## 2019-06-01 DIAGNOSIS — Z3200 Encounter for pregnancy test, result unknown: Secondary | ICD-10-CM | POA: Diagnosis not present

## 2019-07-02 DIAGNOSIS — O26891 Other specified pregnancy related conditions, first trimester: Secondary | ICD-10-CM | POA: Diagnosis not present

## 2019-07-02 DIAGNOSIS — Z3A09 9 weeks gestation of pregnancy: Secondary | ICD-10-CM | POA: Diagnosis not present

## 2019-07-02 DIAGNOSIS — D509 Iron deficiency anemia, unspecified: Secondary | ICD-10-CM | POA: Diagnosis not present

## 2019-07-02 DIAGNOSIS — O09521 Supervision of elderly multigravida, first trimester: Secondary | ICD-10-CM | POA: Diagnosis not present

## 2019-07-02 DIAGNOSIS — Z3689 Encounter for other specified antenatal screening: Secondary | ICD-10-CM | POA: Diagnosis not present

## 2019-07-05 DIAGNOSIS — Z3A09 9 weeks gestation of pregnancy: Secondary | ICD-10-CM | POA: Diagnosis not present

## 2019-07-05 DIAGNOSIS — R109 Unspecified abdominal pain: Secondary | ICD-10-CM | POA: Diagnosis not present

## 2019-07-05 DIAGNOSIS — O26891 Other specified pregnancy related conditions, first trimester: Secondary | ICD-10-CM | POA: Diagnosis not present

## 2019-07-07 ENCOUNTER — Other Ambulatory Visit: Payer: Self-pay | Admitting: Obstetrics and Gynecology

## 2019-07-09 DIAGNOSIS — Z3A09 9 weeks gestation of pregnancy: Secondary | ICD-10-CM | POA: Diagnosis not present

## 2019-07-09 DIAGNOSIS — R110 Nausea: Secondary | ICD-10-CM | POA: Diagnosis not present

## 2019-07-09 DIAGNOSIS — O039 Complete or unspecified spontaneous abortion without complication: Secondary | ICD-10-CM | POA: Diagnosis not present

## 2019-07-09 DIAGNOSIS — O3680X9 Pregnancy with inconclusive fetal viability, other fetus: Secondary | ICD-10-CM | POA: Diagnosis not present

## 2019-07-14 DIAGNOSIS — O039 Complete or unspecified spontaneous abortion without complication: Secondary | ICD-10-CM | POA: Diagnosis not present

## 2019-07-20 ENCOUNTER — Other Ambulatory Visit: Payer: Self-pay | Admitting: Obstetrics and Gynecology

## 2019-07-20 DIAGNOSIS — O039 Complete or unspecified spontaneous abortion without complication: Secondary | ICD-10-CM | POA: Diagnosis not present

## 2019-07-28 DIAGNOSIS — Z09 Encounter for follow-up examination after completed treatment for conditions other than malignant neoplasm: Secondary | ICD-10-CM | POA: Diagnosis not present

## 2019-07-28 DIAGNOSIS — O039 Complete or unspecified spontaneous abortion without complication: Secondary | ICD-10-CM | POA: Diagnosis not present

## 2019-07-28 DIAGNOSIS — O034 Incomplete spontaneous abortion without complication: Secondary | ICD-10-CM | POA: Diagnosis not present

## 2019-08-25 DIAGNOSIS — O039 Complete or unspecified spontaneous abortion without complication: Secondary | ICD-10-CM | POA: Diagnosis not present

## 2019-11-15 ENCOUNTER — Other Ambulatory Visit: Payer: Self-pay

## 2019-11-15 DIAGNOSIS — Z20822 Contact with and (suspected) exposure to covid-19: Secondary | ICD-10-CM

## 2019-11-15 DIAGNOSIS — Z3200 Encounter for pregnancy test, result unknown: Secondary | ICD-10-CM | POA: Diagnosis not present

## 2019-11-15 DIAGNOSIS — Z3689 Encounter for other specified antenatal screening: Secondary | ICD-10-CM | POA: Diagnosis not present

## 2019-11-17 LAB — NOVEL CORONAVIRUS, NAA: SARS-CoV-2, NAA: NOT DETECTED

## 2019-12-01 DIAGNOSIS — Z3201 Encounter for pregnancy test, result positive: Secondary | ICD-10-CM | POA: Diagnosis not present

## 2019-12-07 DIAGNOSIS — O209 Hemorrhage in early pregnancy, unspecified: Secondary | ICD-10-CM | POA: Diagnosis not present

## 2019-12-07 DIAGNOSIS — Z3A01 Less than 8 weeks gestation of pregnancy: Secondary | ICD-10-CM | POA: Diagnosis not present

## 2019-12-15 DIAGNOSIS — O039 Complete or unspecified spontaneous abortion without complication: Secondary | ICD-10-CM | POA: Diagnosis not present

## 2019-12-15 DIAGNOSIS — N96 Recurrent pregnancy loss: Secondary | ICD-10-CM | POA: Diagnosis not present

## 2020-01-11 DIAGNOSIS — N96 Recurrent pregnancy loss: Secondary | ICD-10-CM | POA: Diagnosis not present

## 2020-01-19 DIAGNOSIS — Z3141 Encounter for fertility testing: Secondary | ICD-10-CM | POA: Diagnosis not present

## 2020-02-02 ENCOUNTER — Encounter: Payer: Self-pay | Admitting: *Deleted

## 2020-02-02 ENCOUNTER — Other Ambulatory Visit: Payer: Self-pay

## 2020-02-02 ENCOUNTER — Inpatient Hospital Stay: Payer: BC Managed Care – PPO | Attending: Internal Medicine | Admitting: Internal Medicine

## 2020-02-02 DIAGNOSIS — O99119 Other diseases of the blood and blood-forming organs and certain disorders involving the immune mechanism complicating pregnancy, unspecified trimester: Secondary | ICD-10-CM | POA: Insufficient documentation

## 2020-02-02 DIAGNOSIS — D6859 Other primary thrombophilia: Secondary | ICD-10-CM | POA: Diagnosis not present

## 2020-02-02 NOTE — Progress Notes (Signed)
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  Prothrombin gene mutation/factor V Leiden; protein S; MTHFR- normal.  Antithrombin 3 antigen; anticardiolipin antibody IgM-9; beta-2 glycoprotein antibody IgG IgM- normal; lupus anticoagulant DRV VT-normal; PTT PT normal Oncology History   No history exists.     HISTORY OF PRESENTING ILLNESS:  [name] 39 y.o.  female has been referred to [place] for further evaluation and recommendations for low protein S the context of 2 miscarriages.  Patient states to have normal pregnancy 14 years ago.   However patient has had 2 miscarriages one in July and again in December 2020.  Given the absence of any obvious etiology patient underwent-further work-up for thrombophilia panel-which was remarkable for abnormal protein S/summarized above.  Patient does not have liver disease or any diagnosis of IBD.  With regards risk factors: Previous history of DVT/PE: none Family history: none Birth control pills: none   Review of Systems  Constitutional: Negative for chills, diaphoresis, fever, malaise/fatigue and weight loss.  HENT: Negative for nosebleeds and sore throat.   Eyes: Negative for double vision.  Respiratory: Negative for cough, hemoptysis, sputum production, shortness of breath and wheezing.   Cardiovascular: Negative for chest pain, palpitations, orthopnea and leg swelling.  Gastrointestinal: Negative for abdominal pain, blood in stool, constipation, diarrhea, heartburn, melena, nausea and vomiting.  Genitourinary: Negative for dysuria, frequency and urgency.  Musculoskeletal: Negative for back pain and joint pain.  Skin: Negative.  Negative for itching and rash.  Neurological: Negative for dizziness, tingling, focal  weakness, weakness and headaches.  Endo/Heme/Allergies: Does not bruise/bleed easily.  Psychiatric/Behavioral: Negative for depression. The patient is not nervous/anxious and does not have insomnia.      MEDICAL HISTORY:  Past Medical History:  Diagnosis Date  . History of chicken pox   . History of miscarriage   . Protein S deficiency (HCC)     SURGICAL HISTORY: Past Surgical History:  Procedure Laterality Date  . CESAREAN SECTION      SOCIAL HISTORY: Social History   Socioeconomic History  . Marital status: Single    Spouse name: Not on file  . Number of children: Not on file  . Years of education: Not on file  . Highest education level: Not on file  Occupational History  . Not on file  Tobacco Use  . Smoking status: Never Smoker  . Smokeless tobacco: Never Used  Substance and Sexual Activity  . Alcohol use: Yes    Comment: occasional  . Drug use: No  . Sexual activity: Yes    Birth control/protection: Condom  Other Topics Concern  . 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    Social Determinants of Health   Financial Resource Strain:   . Difficulty of Paying Living Expenses: Not on file  Food Insecurity:   . Worried About Programme researcher, broadcasting/film/video in the Last Year: Not on file  . Ran Out of Food in the Last Year: Not on file  Transportation Needs:   . Lack of Transportation (Medical): Not on file  . Lack of Transportation (Non-Medical): Not on file  Physical Activity:   . Days of Exercise per Week: Not on file  . Minutes of Exercise per Session:  Not on file  Stress:   . Feeling of Stress : Not on file  Social Connections:   . Frequency of Communication with Friends and Family: Not on file  . Frequency of Social Gatherings with Friends and Family: Not on file  . Attends Religious Services: Not on file  . Active Member of Clubs or Organizations: Not on file  . Attends Theatre manager Meetings: Not on file  . Marital Status: Not on file  Intimate Partner Violence:   . Fear of Current or Ex-Partner: Not on file  . Emotionally Abused: Not on file  . Physically Abused: Not on file  . Sexually Abused: Not on file    FAMILY HISTORY: Family History  Problem Relation Age of Onset  . Cancer Mother   . Cancer Father     ALLERGIES:  has No Known Allergies.  MEDICATIONS:  Current Outpatient Medications  Medication Sig Dispense Refill  . ferrous sulfate 325 (65 FE) MG tablet Take 1 tablet (325 mg total) by mouth daily with breakfast. 30 tablet 1  . Prenatal Vit-Fe Fumarate-FA (MULTIVITAMIN-PRENATAL) 27-0.8 MG TABS tablet Take 1 tablet by mouth daily at 12 noon.    . vitamin B-12 (CYANOCOBALAMIN) 1000 MCG tablet Take 1,000 mcg by mouth daily.     No current facility-administered medications for this visit.   PHYSICAL EXAMINATION:  Vitals:   02/02/20 1424  BP: 137/68  Pulse: (!) 108  Temp: 98.1 F (36.7 C)   Filed Weights   02/02/20 1424  Weight: 279 lb (126.6 kg)    Physical Exam  Constitutional: She is oriented to person, place, and time and well-developed, well-nourished, and in no distress.  HENT:  Head: Normocephalic and atraumatic.  Mouth/Throat: Oropharynx is clear and moist. No oropharyngeal exudate.  Eyes: Pupils are equal, round, and reactive to light.  Cardiovascular: Normal rate and regular rhythm.  Pulmonary/Chest: No respiratory distress. She has no wheezes.  Abdominal: Soft. Bowel sounds are normal. She exhibits no distension and no mass. There is no abdominal tenderness. There is no rebound and no guarding.  Musculoskeletal:        General: No tenderness or edema. Normal range of motion.     Cervical back: Normal range of motion and neck supple.  Neurological: She is alert and oriented to person, place, and time.  Skin: Skin is warm.  Psychiatric: Affect normal.     LABORATORY DATA:  I have reviewed the data as listed Lab  Results  Component Value Date   WBC 11.9 (H) 06/26/2018   HGB 10.4 (L) 06/26/2018   HCT 33.4 (L) 06/26/2018   MCV 61.7 (L) 06/26/2018   PLT 335 06/26/2018   No results for input(s): NA, K, CL, CO2, GLUCOSE, BUN, CREATININE, CALCIUM, GFRNONAA, GFRAA, PROT, ALBUMIN, AST, ALT, ALKPHOS, BILITOT, BILIDIR, IBILI in the last 8760 hours.  RADIOGRAPHIC STUDIES: I have personally reviewed the radiological images as listed and agreed with the findings in the report. No results found.  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   Discussed with the patient at length-importance of coagulation factors in general and in clotting diseases.  Patient has not had any possible history of DVT/PE.  No family history of any clotting disorders.   #however is very important to reconfirm the diagnosis with repeated testing as tests can be variable-based on multiple factors like liver disease IBD.  Patient does not have any  clinical evidence of status that could explain her low levels.   #Discussed that if patient protein S deficiency is confirmed on repeat testing; no other cause of repeated pregnancy loss is noted-anticoagulation would be recommended in the during pregnancy/peripartum.   Thank you Dr.Cousins for allowing me to participate in the care of your pleasant patient. Please do not hesitate to contact me with questions or concerns in the interim.  # DISPOSITION: # 2 months- MD labs- 2 weeks prior-Dr.B  All questions were answered. The patient knows to call the clinic with any problems, questions or concerns.    Earna Coder, MD 02/03/2020 8:00 AM

## 2020-02-02 NOTE — Assessment & Plan Note (Addendum)
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   Discussed with the patient at length-importance of coagulation factors in general and in clotting diseases.  Patient has not had any possible history of DVT/PE.  No family history of any clotting disorders.   #however is very important to reconfirm the diagnosis with repeated testing as tests can be variable-based on multiple factors like liver disease IBD.  Patient does not have any clinical evidence of status that could explain her low levels.   #Discussed that if patient protein S deficiency is confirmed on repeat testing; no other cause of repeated pregnancy loss is noted-anticoagulation would be recommended in the during pregnancy/peripartum.   Thank you Dr.Cousins for allowing me to participate in the care of your pleasant patient. Please do not hesitate to contact me with questions or concerns in the interim.  # DISPOSITION: # 2 months- MD labs- 2 weeks prior-Dr.B

## 2020-03-22 ENCOUNTER — Inpatient Hospital Stay: Payer: BC Managed Care – PPO | Attending: Internal Medicine

## 2020-03-22 ENCOUNTER — Other Ambulatory Visit: Payer: Self-pay

## 2020-03-22 DIAGNOSIS — D6859 Other primary thrombophilia: Secondary | ICD-10-CM | POA: Insufficient documentation

## 2020-03-22 LAB — CBC WITH DIFFERENTIAL/PLATELET
Abs Immature Granulocytes: 0.05 10*3/uL (ref 0.00–0.07)
Basophils Absolute: 0 10*3/uL (ref 0.0–0.1)
Basophils Relative: 0 %
Eosinophils Absolute: 0.6 10*3/uL — ABNORMAL HIGH (ref 0.0–0.5)
Eosinophils Relative: 4 %
HCT: 33.7 % — ABNORMAL LOW (ref 36.0–46.0)
Hemoglobin: 10.3 g/dL — ABNORMAL LOW (ref 12.0–15.0)
Immature Granulocytes: 0 %
Lymphocytes Relative: 27 %
Lymphs Abs: 3.7 10*3/uL (ref 0.7–4.0)
MCH: 20.3 pg — ABNORMAL LOW (ref 26.0–34.0)
MCHC: 30.6 g/dL (ref 30.0–36.0)
MCV: 66.3 fL — ABNORMAL LOW (ref 80.0–100.0)
Monocytes Absolute: 1 10*3/uL (ref 0.1–1.0)
Monocytes Relative: 7 %
Neutro Abs: 8.4 10*3/uL — ABNORMAL HIGH (ref 1.7–7.7)
Neutrophils Relative %: 62 %
Platelets: 345 10*3/uL (ref 150–400)
RBC: 5.08 MIL/uL (ref 3.87–5.11)
RDW: 17.1 % — ABNORMAL HIGH (ref 11.5–15.5)
WBC: 13.8 10*3/uL — ABNORMAL HIGH (ref 4.0–10.5)
nRBC: 0 % (ref 0.0–0.2)

## 2020-03-22 LAB — COMPREHENSIVE METABOLIC PANEL
ALT: 11 U/L (ref 0–44)
AST: 13 U/L — ABNORMAL LOW (ref 15–41)
Albumin: 3.6 g/dL (ref 3.5–5.0)
Alkaline Phosphatase: 98 U/L (ref 38–126)
Anion gap: 7 (ref 5–15)
BUN: 10 mg/dL (ref 6–20)
CO2: 24 mmol/L (ref 22–32)
Calcium: 8.6 mg/dL — ABNORMAL LOW (ref 8.9–10.3)
Chloride: 104 mmol/L (ref 98–111)
Creatinine, Ser: 0.96 mg/dL (ref 0.44–1.00)
GFR calc Af Amer: >60 mL/min (ref >60)
GFR calc non Af Amer: >60 mL/min (ref >60)
Glucose, Bld: 116 mg/dL — ABNORMAL HIGH (ref 70–99)
Potassium: 3.8 mmol/L (ref 3.5–5.1)
Sodium: 135 mmol/L (ref 135–145)
Total Bilirubin: 0.2 mg/dL — ABNORMAL LOW (ref 0.3–1.2)
Total Protein: 7.6 g/dL (ref 6.5–8.1)

## 2020-03-23 LAB — PROTEIN S ACTIVITY: Protein S Activity: 39 % — ABNORMAL LOW (ref 63–140)

## 2020-03-23 LAB — PROTEIN S, TOTAL: Protein S Ag, Total: 51 % — ABNORMAL LOW (ref 60–150)

## 2020-04-05 ENCOUNTER — Telehealth: Payer: Self-pay | Admitting: *Deleted

## 2020-04-05 ENCOUNTER — Inpatient Hospital Stay: Payer: BC Managed Care – PPO | Attending: Internal Medicine | Admitting: Internal Medicine

## 2020-04-05 ENCOUNTER — Other Ambulatory Visit: Payer: Self-pay | Admitting: Obstetrics and Gynecology

## 2020-04-05 ENCOUNTER — Other Ambulatory Visit: Payer: Self-pay

## 2020-04-05 DIAGNOSIS — D6851 Activated protein C resistance: Secondary | ICD-10-CM | POA: Diagnosis not present

## 2020-04-05 DIAGNOSIS — O99119 Other diseases of the blood and blood-forming organs and certain disorders involving the immune mechanism complicating pregnancy, unspecified trimester: Secondary | ICD-10-CM | POA: Diagnosis not present

## 2020-04-05 DIAGNOSIS — Z86718 Personal history of other venous thrombosis and embolism: Secondary | ICD-10-CM | POA: Insufficient documentation

## 2020-04-05 DIAGNOSIS — D6852 Prothrombin gene mutation: Secondary | ICD-10-CM | POA: Diagnosis not present

## 2020-04-05 DIAGNOSIS — Z86711 Personal history of pulmonary embolism: Secondary | ICD-10-CM | POA: Insufficient documentation

## 2020-04-05 DIAGNOSIS — D6859 Other primary thrombophilia: Secondary | ICD-10-CM | POA: Diagnosis not present

## 2020-04-05 NOTE — Assessment & Plan Note (Addendum)
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  Repeat testing-March 2021-protein S total antigen- 51%; protein S activity 39%.  #Reviewed the above results and their significance with the patient in detail.  In absence of any other cause of patient's miscarriage-protein S deficiency most likely cause.  I left a message for Dr. Cherly Hensen to discuss regarding the cause of patient's miscarriages.  #I discussed the use of Lovenox subcu injection [therapeutic dose]-from the time of conception until 24 hours prior to delivery in an attempt to prevent miscarriages.  Patient will call us when she conceives; to get prescription for Lovenox.  #Vaccine counseling-I think is reasonable for the patient to proceed with either moderna or Pfizer vaccine.  However I would recommend avoiding J&J vaccine given the risk of blood clots [although 1 million]  #Had a long discussion with the patient regarding situations where she could be at high risk of blood clots including immobility surgeries-where she should take extra precaution including mobility/and if needed anticoagulation.   # DISPOSITION: # follow up as needed/pt will call Dr.B

## 2020-04-05 NOTE — Progress Notes (Signed)
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  Prothrombin gene mutation/factor V Leiden; protein S; MTHFR- normal.  Antithrombin 3 antigen; anticardiolipin antibody IgM-9; beta-2 glycoprotein antibody IgG IgM- normal; lupus anticoagulant DRV VT-normal; PTT PT normal;  Repeat testing-March 2021-protein S total antigen- 51%; protein S activity 39%.  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     HISTORY OF PRESENTING ILLNESS:  [name] 39 y.o.  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   Patient denies any leg swelling or unusual shortness of breath or chest pain.  Patient is quite nervous.   Review of Systems  Constitutional: Negative for chills, diaphoresis, fever, malaise/fatigue and weight loss.  HENT: Negative for nosebleeds and sore throat.   Eyes: Negative for double vision.  Respiratory: Negative for cough, hemoptysis, sputum production, shortness of breath and wheezing.   Cardiovascular: Negative for chest pain, palpitations, orthopnea and leg swelling.  Gastrointestinal: Negative for abdominal pain, blood in stool, constipation, diarrhea, heartburn, melena, nausea and vomiting.  Genitourinary: Negative for dysuria, frequency and urgency.  Musculoskeletal: Negative for back pain and joint pain.  Skin: Negative.  Negative for itching and rash.  Neurological: Negative for dizziness, tingling, focal weakness, weakness and headaches.  Endo/Heme/Allergies: Does not bruise/bleed easily.  Psychiatric/Behavioral: Negative for depression. The patient is nervous/anxious. The patient does not have insomnia.      MEDICAL HISTORY:  Past  Medical History:  Diagnosis Date  . History of chicken pox   . History of miscarriage   . Protein S deficiency ([place])     SURGICAL HISTORY: Past Surgical History:  Procedure Laterality Date  . CESAREAN SECTION      SOCIAL HISTORY: Social History   Socioeconomic History  . Marital status: Single    Spouse name: Not on file  . Number of children: Not on file  . Years of education: Not on file  . Highest education level: Not on file  Occupational History  . Not on file  Tobacco Use  . Smoking status: Never Smoker  . Smokeless tobacco: Never Used  Substance and Sexual Activity  . Alcohol use: Yes    Comment: occasional  . Drug use: No  . Sexual activity: Yes    Birth control/protection: Condom  Other Topics Concern  . 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    Social Determinants of Health   Financial Resource Strain:   . Difficulty of Paying Living Expenses:   Food Insecurity:   . Worried About Charity fundraiser in the Last Year:   . Arboriculturist in the Last Year:   Transportation Needs:   . Film/video editor (Medical):   Marland Kitchen Lack of Transportation (Non-Medical):   Physical Activity:   . Days of Exercise per Week:   . Minutes of Exercise per Session:   Stress:   . Feeling of Stress :   Social Connections:   . Frequency of Communication with Friends and Family:   . Frequency of Social Gatherings with Friends and Family:   . Attends Religious Services:   . Active Member  of Clubs or Organizations:   . Attends Banker Meetings:   Marland Kitchen Marital Status:   Intimate Partner Violence:   . Fear of Current or Ex-Partner:   . Emotionally Abused:   Marland Kitchen Physically Abused:   . Sexually Abused:     FAMILY HISTORY: Family History  Problem Relation Age of Onset  . Cancer Mother   . Cancer Father     ALLERGIES:  has No Known Allergies.  MEDICATIONS:  Current Outpatient  Medications  Medication Sig Dispense Refill  . ferrous sulfate 325 (65 FE) MG tablet Take 1 tablet (325 mg total) by mouth daily with breakfast. 30 tablet 1  . Prenatal Vit-Fe Fumarate-FA (MULTIVITAMIN-PRENATAL) 27-0.8 MG TABS tablet Take 1 tablet by mouth daily at 12 noon.    . vitamin B-12 (CYANOCOBALAMIN) 1000 MCG tablet Take 1,000 mcg by mouth daily.     No current facility-administered medications for this visit.   PHYSICAL EXAMINATION:  Vitals:   04/05/20 1409  BP: 132/78  Pulse: 84  Resp: 16  Temp: 98.5 F (36.9 C)   Filed Weights   04/05/20 1409  Weight: 286 lb 6.4 oz (129.9 kg)    Physical Exam  Constitutional: She is oriented to person, place, and time and well-developed, well-nourished, and in no distress.  HENT:  Head: Normocephalic and atraumatic.  Mouth/Throat: Oropharynx is clear and moist. No oropharyngeal exudate.  Eyes: Pupils are equal, round, and reactive to light.  Cardiovascular: Normal rate and regular rhythm.  Pulmonary/Chest: No respiratory distress. She has no wheezes.  Abdominal: Soft. Bowel sounds are normal. She exhibits no distension and no mass. There is no abdominal tenderness. There is no rebound and no guarding.  Musculoskeletal:        General: No tenderness or edema. Normal range of motion.     Cervical back: Normal range of motion and neck supple.  Neurological: She is alert and oriented to person, place, and time.  Skin: Skin is warm.  Psychiatric: Affect normal.     LABORATORY DATA:  I have reviewed the data as listed Lab Results  Component Value Date   WBC 13.8 (H) 03/22/2020   HGB 10.3 (L) 03/22/2020   HCT 33.7 (L) 03/22/2020   MCV 66.3 (L) 03/22/2020   PLT 345 03/22/2020   Recent Labs    03/22/20 1456  NA 135  K 3.8  CL 104  CO2 24  GLUCOSE 116*  BUN 10  CREATININE 0.96  CALCIUM 8.6*  GFRNONAA >60  GFRAA >60  PROT 7.6  ALBUMIN 3.6  AST 13*  ALT 11  ALKPHOS 98  BILITOT 0.2*    RADIOGRAPHIC STUDIES: I  have personally reviewed the radiological images as listed and agreed with the findings in the report. No results found.  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  Repeat testing-March 2021-protein S total antigen- 51%; protein S activity 39%.  #Reviewed the above results and their significance with the patient in detail.  In absence of any other cause of patient's miscarriage-protein S deficiency most likely cause.  I left a message for Dr. Cherly Hensen to discuss regarding the cause of patient's miscarriages.  #I discussed the use of Lovenox subcu injection [therapeutic dose]-from the time of conception until 24 hours prior to delivery in an attempt to prevent miscarriages.  Patient will call us when she conceives; to get prescription for Lovenox.  #Vaccine counseling-I think is reasonable  for the patient to proceed with either moderna or [employer] vaccine.  However I would recommend avoiding J&J vaccine given the risk of blood clots [although 1 million]  #Had a long discussion with the patient regarding situations where she could be at high risk of blood clots including immobility surgeries-where she should take extra precaution including mobility/and if needed anticoagulation.   # DISPOSITION: # follow up as needed/pt will call Dr.B  All questions were answered. The patient knows to call the clinic with any problems, questions or concerns.    Earna Coder, MD 04/05/2020 4:24 PM

## 2020-04-05 NOTE — Progress Notes (Signed)
Pt in for follow up and lab results.  Denies any concerns today.

## 2020-04-06 DIAGNOSIS — M7541 Impingement syndrome of right shoulder: Secondary | ICD-10-CM | POA: Diagnosis not present

## 2020-04-14 NOTE — Telephone Encounter (Signed)
Telephone note

## 2020-04-20 ENCOUNTER — Other Ambulatory Visit: Payer: Self-pay | Admitting: Obstetrics and Gynecology

## 2020-05-03 DIAGNOSIS — Z3689 Encounter for other specified antenatal screening: Secondary | ICD-10-CM | POA: Diagnosis not present

## 2020-05-03 DIAGNOSIS — Z3201 Encounter for pregnancy test, result positive: Secondary | ICD-10-CM | POA: Diagnosis not present

## 2020-05-03 DIAGNOSIS — N915 Oligomenorrhea, unspecified: Secondary | ICD-10-CM | POA: Diagnosis not present

## 2020-05-05 DIAGNOSIS — N915 Oligomenorrhea, unspecified: Secondary | ICD-10-CM | POA: Diagnosis not present

## 2020-05-16 ENCOUNTER — Telehealth: Payer: Self-pay | Admitting: *Deleted

## 2020-05-16 DIAGNOSIS — Z3201 Encounter for pregnancy test, result positive: Secondary | ICD-10-CM | POA: Diagnosis not present

## 2020-05-16 NOTE — Telephone Encounter (Signed)
Patient called and states that she ust left her GYN's office and that she needs Dr B to go ahead and order the Lovenox that was discussed at her last visit. She states she has given herself B1 injections in the past and feels that she can do the Lovenox injections.

## 2020-05-17 NOTE — Telephone Encounter (Signed)
Dr. B please advise. Thanks.

## 2020-05-17 NOTE — Telephone Encounter (Signed)
Patient notified that Dr. Leonard Schwartz will send in her lovenox prescription to her pharmacy. He will call her to discuss the instructions by the end of the work day.

## 2020-05-18 ENCOUNTER — Telehealth: Payer: Self-pay | Admitting: Internal Medicine

## 2020-05-18 MED ORDER — ENOXAPARIN SODIUM 150 MG/ML ~~LOC~~ SOLN: 150.0000 mg | SUBCUTANEOUS | 1 refills | Status: DC

## 2020-05-18 NOTE — Telephone Encounter (Signed)
On 5/26-left voicemail for the patient to call back to discuss Lovenox injections/prescriptions. FYI-

## 2020-05-18 NOTE — Telephone Encounter (Signed)
Finally able to speak to pt. Pt is currently [redacted] weeks pregnant. I spoke pt re: lovenox; pt will start lovenox 150 mg SQ [~1mg /kg dose SQ]. [facility]. Discussed that she will need lovenox teaching.   Pt needs LOvenox teaching/ discussed Heather. Will need to be schedule with [facility] for tomorrow.   Darel Hong- please check on pt's costs for her lovenox injections.   Thanks, GB

## 2020-05-18 NOTE — Telephone Encounter (Signed)
I will call patient and schedule a time for Lovenox teaching.

## 2020-05-18 NOTE — Telephone Encounter (Signed)
Spoke to patient via telephone re:Lovenox injection teaching. Provided patient with my cell #, so that she can call me when she picks up the medication this evening, and I will [facility] to do teaching.

## 2020-05-19 ENCOUNTER — Encounter: Payer: BC Managed Care – PPO | Admitting: Oncology

## 2020-05-19 ENCOUNTER — Telehealth: Payer: Self-pay | Admitting: Pharmacist

## 2020-05-19 NOTE — Telephone Encounter (Signed)
After checking with Darel Hong, [facility] was going to be over $900.00 because she has yet to meet her deductible. Patient stated that she could not afford that, so Darel Hong is going to see if patient will qualify for patient assistance. Spoke to [facility] would approved a 1 week supply of the Lovenox to be paid for by the [employer]. Medication called to [facility]. Patient notified of the changes.

## 2020-05-19 NOTE — Telephone Encounter (Signed)
Oral Chemotherapy Pharmacist Encounter   Received a call from Ms. Wegmann in reference to her lovenox. She stated that she spoke with her OB/GYN who told her she thought should be on a lower prophylaxis dosing and that the patient should call and verify with the cancer center before she picks up and starts the injection.  Dr. Donneta Romberg is out of the office today, so the above information was shared on-call MD Dr. Orlie Dakin. He reviewed the patient and confirmed Dr. Sharlette Dense intention of starting the patient in therapeutic dosing.  Returned call to patient to let her know.  Remi Haggard, PharmD, BCPS, BCOP, CPP Hematology/Oncology Clinical Pharmacist [facility] Oral Chemotherapy [facility] [phone]  05/19/2020 1:50 PM

## 2020-05-23 ENCOUNTER — Telehealth: Payer: Self-pay | Admitting: Internal Medicine

## 2020-05-23 ENCOUNTER — Telehealth: Payer: Self-pay | Admitting: *Deleted

## 2020-05-23 NOTE — Telephone Encounter (Signed)
On 6/01-spoke to patient regarding the rationale for intermittent dosing of Lovenox-1 mg/kg once a day.   Also discussed patient's concern regarding financial difficulty with Lovenox; will discuss with Darel Hong.  Also discussed with the patient to have her gynecologist Dr. Cherly Hensen to call us back.  Left messages for callback to discuss patient's care

## 2020-05-23 NOTE — Telephone Encounter (Signed)
Patient has called again today regarding her Lovenox dose of being therapeutic dosing and not a prophylactic dose. She states that she has not picked up prescription form pharmacy yet wanting to speak with Dr Donneta Romberg regarding this

## 2020-05-23 NOTE — Telephone Encounter (Signed)
Patient was informed at the time of her call that it woulkd be later this afternoon before Dr Donneta Romberg would call her

## 2020-05-23 NOTE — Telephone Encounter (Signed)
Steward Drone- please inform pt that I will reach her this afternoon.  H/T- please reach out to Dr.Cousins's office/Gynecology; and have Dr.Cousins call me back. I had previously left message for Dr.Cousins; and I did not get a call back. Thanks. GB

## 2020-05-24 ENCOUNTER — Telehealth: Payer: Self-pay | Admitting: Internal Medicine

## 2020-05-24 NOTE — Telephone Encounter (Signed)
On 6/3-spoke to patient regarding financial issues with Lovenox injection.   As per Judy/discussion with pharmacy ~approximately $1200 deductible; after that Lovenox generic $10/brand $40. Pt made aware of the costs.   Recommend patient start taking Lovenox as ordered; patient also reached out to her gynecologist to call us.  Awaiting to speak to gynecologist.

## 2020-05-24 NOTE — Telephone Encounter (Signed)
I called the patients pharmacy plan to check benefits.  Rep stated that patient had $1174.50 left to meet her deductible.  After the deductible has been met, generics will be $10 and brand $40.

## 2020-05-24 NOTE — Telephone Encounter (Signed)
Thanks, Darel Hong for the checking. GB

## 2020-06-05 ENCOUNTER — Telehealth: Payer: Self-pay | Admitting: Internal Medicine

## 2020-06-05 NOTE — Telephone Encounter (Signed)
On 6/14-finally able to speak to patient obstetrician Dr. Cherly Hensen.  Discussed the rationale for intermittent dosing of Lovenox.   I left a message for the patient regarding my discussion with Dr. Cherly Hensen; and sent mychart messg. ---------------------------------------------------------------------------------- Hi-I was able to speak to Dr. Cherly Hensen today.  As per Dr. Cherly Hensen given the absence of any other cause of her previous pregnancy loss-protein S deficiency is the most likely cause of loss of pregnancy.  I discussed the Lovenox dosing 150 mg/day-given the history of documented protein S deficiency.   I would recommend the current dose of Lovenox; please let us know if you have any questions or concerns.   Thank you, Dr.B ----------------------------------------------------------------------------------

## 2020-06-06 DIAGNOSIS — Z3689 Encounter for other specified antenatal screening: Secondary | ICD-10-CM | POA: Diagnosis not present

## 2020-06-06 DIAGNOSIS — O09521 Supervision of elderly multigravida, first trimester: Secondary | ICD-10-CM | POA: Diagnosis not present

## 2020-06-06 DIAGNOSIS — Z3A09 9 weeks gestation of pregnancy: Secondary | ICD-10-CM | POA: Diagnosis not present

## 2020-06-13 DIAGNOSIS — Z1151 Encounter for screening for human papillomavirus (HPV): Secondary | ICD-10-CM | POA: Diagnosis not present

## 2020-06-13 DIAGNOSIS — Z118 Encounter for screening for other infectious and parasitic diseases: Secondary | ICD-10-CM | POA: Diagnosis not present

## 2020-06-13 DIAGNOSIS — O09521 Supervision of elderly multigravida, first trimester: Secondary | ICD-10-CM | POA: Diagnosis not present

## 2020-06-13 DIAGNOSIS — Z3689 Encounter for other specified antenatal screening: Secondary | ICD-10-CM | POA: Diagnosis not present

## 2020-06-13 DIAGNOSIS — Z3A10 10 weeks gestation of pregnancy: Secondary | ICD-10-CM | POA: Diagnosis not present

## 2020-06-13 DIAGNOSIS — Z124 Encounter for screening for malignant neoplasm of cervix: Secondary | ICD-10-CM | POA: Diagnosis not present

## 2020-06-22 ENCOUNTER — Telehealth: Payer: Self-pay | Admitting: *Deleted

## 2020-06-22 ENCOUNTER — Telehealth (HOSPITAL_BASED_OUTPATIENT_CLINIC_OR_DEPARTMENT_OTHER): Payer: BC Managed Care – PPO | Admitting: Oncology

## 2020-06-22 DIAGNOSIS — O99119 Other diseases of the blood and blood-forming organs and certain disorders involving the immune mechanism complicating pregnancy, unspecified trimester: Secondary | ICD-10-CM

## 2020-06-22 DIAGNOSIS — D6859 Other primary thrombophilia: Secondary | ICD-10-CM | POA: Diagnosis not present

## 2020-06-22 DIAGNOSIS — L509 Urticaria, unspecified: Secondary | ICD-10-CM

## 2020-06-22 MED ORDER — TRIAMCINOLONE ACETONIDE 0.5 % EX OINT
1.0000 | TOPICAL_OINTMENT | Freq: Three times a day (TID) | CUTANEOUS | 1 refills | Status: DC
Start: 2020-06-22 — End: 2020-12-07

## 2020-06-22 MED ORDER — LORATADINE 10 MG PO TABS: 10.0000 mg | ORAL_TABLET | Freq: Every day | ORAL | 2 refills | Status: DC

## 2020-06-22 NOTE — Progress Notes (Signed)
Symptom Management Consult note [facility]  [phone] [phone]  Patient Care Team: Patient, No Pcp Per as PCP - General (General Practice)   Name of the patient: [name]  [id number]  Nov 17, 1981   Date of visit: 06/22/2020   Diagnosis- Protein C deficiency- on lovenox during pregnancy  Chief complaint/ Reason for visit- Hives/rash   I connected with Valine Mcisaac on 06/22/20 at 10:30 AM EDT by video enabled telemedicine visit and verified that I am speaking with the correct person using two identifiers.   I discussed the limitations, risks, security and privacy concerns of performing an evaluation and management service by telemedicine and the availability of in-person appointments. I also discussed with the patient that there may be a patient responsible charge related to this service. The patient expressed understanding and agreed to proceed.   Other persons participating in the visit and their role in the encounter: None  Patient's location: Home Provider's location: Clinic   Heme/Onc history:  Oncology History   No history exists.   Interval history-Mrs. Stlouis is a 39 year old female with past medical history significant for protein S deficiency and 2 recent miscarriages.  She had a normal pregnancy approximately 14 years ago.  Given the absence of any obvious etiology of her miscarriages, patient underwent further work-up for thrombophilia which was remarkable for abnormal protein.  Labs were repeated and consistent with low protein S.  Will require anticoagulation during her pregnancies in the future.  Patient is currently on Lovenox twice daily.  She is currently [redacted] weeks pregnant.  She started Lovenox about 1 month ago.  Today, patient complains of hives and itching around her injection sites.  Symptoms started about 4-5 days ago. Symptoms appear to be worsening and spreading along her abdomen. Associated  symptoms include itching and discomfort.  She has not tried any topical creams or OTC medications.  She denies any new exposures to lotions or laundry detergents.  She denies any new medications.  She has been applying an ice pack and alcohol to injection site prior to injection.  She is rotating her injections.  She notes significant bruising after injections.  She denies hematomas.  She denies any nausea, vomiting, fever, chest pain or shortness of breath.  ECOG FS:1 - Symptomatic but completely ambulatory  Review of systems- Review of Systems  Constitutional: Negative.  Negative for chills, fever, malaise/fatigue and weight loss.  HENT: Negative for congestion, ear pain and tinnitus.   Eyes: Negative.  Negative for blurred vision and double vision.  Respiratory: Negative.  Negative for cough, sputum production and shortness of breath.   Cardiovascular: Negative.  Negative for chest pain, palpitations and leg swelling.  Gastrointestinal: Negative.  Negative for abdominal pain, constipation, diarrhea, nausea and vomiting.  Genitourinary: Negative for dysuria, frequency and urgency.  Musculoskeletal: Negative for back pain and falls.  Skin: Positive for itching and rash.  Neurological: Negative.  Negative for weakness and headaches.  Endo/Heme/Allergies: Negative.  Does not bruise/bleed easily.  Psychiatric/Behavioral: Negative.  Negative for depression. The patient is not nervous/anxious and does not have insomnia.      Current treatment- Lovenox BID  No Known Allergies   Past Medical History:  Diagnosis Date  . History of chicken pox   . History of miscarriage   . Protein S deficiency [facility])      Past Surgical History:  Procedure Laterality Date  . CESAREAN SECTION      Social History   Socioeconomic  History  . Marital status: Single    Spouse name: Not on file  . Number of children: Not on file  . Years of education: Not on file  . Highest education level: Not on file    Occupational History  . Not on file  Tobacco Use  . Smoking status: Never Smoker  . Smokeless tobacco: Never Used  Substance and Sexual Activity  . Alcohol use: Yes    Comment: occasional  . Drug use: No  . Sexual activity: Yes    Birth control/protection: Condom  Other Topics Concern  . 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    Social Determinants of Health   Financial Resource Strain:   . Difficulty of Paying Living Expenses:   Food Insecurity:   . Worried About Programme researcher, broadcasting/film/video in the Last Year:   . Barista in the Last Year:   Transportation Needs:   . Freight forwarder (Medical):   Marland Kitchen Lack of Transportation (Non-Medical):   Physical Activity:   . Days of Exercise per Week:   . Minutes of Exercise per Session:   Stress:   . Feeling of Stress :   Social Connections:   . Frequency of Communication with Friends and Family:   . Frequency of Social Gatherings with Friends and Family:   . Attends Religious Services:   . Active Member of Clubs or Organizations:   . Attends Banker Meetings:   Marland Kitchen Marital Status:   Intimate Partner Violence:   . Fear of Current or Ex-Partner:   . Emotionally Abused:   Marland Kitchen Physically Abused:   . Sexually Abused:     Family History  Problem Relation Age of Onset  . Cancer Mother   . Cancer Father      Current Outpatient Medications:  .  enoxaparin (LOVENOX) 150 MG/ML injection, Inject 1 mL (150 mg total) into the skin daily., Disp: 30 mL, Rfl: 1 .  ferrous sulfate 325 (65 FE) MG tablet, Take 1 tablet (325 mg total) by mouth daily with breakfast., Disp: 30 tablet, Rfl: 1 .  loratadine (CLARITIN) 10 MG tablet, Take 1 tablet (10 mg total) by mouth daily., Disp: 90 tablet, Rfl: 2 .  Prenatal Vit-Fe Fumarate-FA (MULTIVITAMIN-PRENATAL) 27-0.8 MG TABS tablet, Take 1 tablet by mouth daily at 12 noon., Disp: , Rfl:  .  triamcinolone  ointment (KENALOG) 0.5 %, Apply 1 application topically 3 (three) times daily., Disp: 30 g, Rfl: 1 .  vitamin B-12 (CYANOCOBALAMIN) 1000 MCG tablet, Take 1,000 mcg by mouth daily., Disp: , Rfl:   Physical exam: There were no vitals filed for this visit. Physical Exam Constitutional:      Appearance: Normal appearance.  HENT:     Head: Normocephalic and atraumatic.  Eyes:     Pupils: Pupils are equal, round, and reactive to light.  Cardiovascular:     Rate and Rhythm: Normal rate and regular rhythm.     Heart sounds: Normal heart sounds. No murmur heard.   Pulmonary:     Effort: Pulmonary effort is normal.     Breath sounds: Normal breath sounds. No wheezing.  Abdominal:     General: Bowel sounds are normal. There is no distension.     Palpations: Abdomen is soft.     Tenderness: There is no abdominal tenderness.  Musculoskeletal:        General: Normal range of motion.  Cervical back: Normal range of motion.  Skin:    General: Skin is warm and dry.     Findings: Bruising and rash present. Rash is urticarial.     Comments: Uticarial leisons surroudning injection sites from lovenox injections- appear localized  Neurological:     Mental Status: She is alert and oriented to person, place, and time.  Psychiatric:        Judgment: Judgment normal.      CMP Latest Ref Rng & Units 03/22/2020  Glucose 70 - 99 mg/dL 270(W)  BUN 6 - 20 mg/dL 10  Creatinine 2.37 - 6.28 mg/dL 3.15  Sodium 176 - 160 mmol/L 135  Potassium 3.5 - 5.1 mmol/L 3.8  Chloride 98 - 111 mmol/L 104  CO2 22 - 32 mmol/L 24  Calcium 8.9 - 10.3 mg/dL 7.3(X)  Total Protein 6.5 - 8.1 g/dL 7.6  Total Bilirubin 0.3 - 1.2 mg/dL 1.0(G)  Alkaline Phos 38 - 126 U/L 98  AST 15 - 41 U/L 13(L)  ALT 0 - 44 U/L 11   CBC Latest Ref Rng & Units 03/22/2020  WBC 4.0 - 10.5 K/uL 13.8(H)  Hemoglobin 12.0 - 15.0 g/dL 10.3(L)  Hematocrit 36 - 46 % 33.7(L)  Platelets 150 - 400 K/uL 345    No images are attached to the  encounter.  No results found.  Assessment and plan- Patient is a 39 y.o. female who presents to symptom management for concerns of side effects from Lovenox injections in her abdomen.  Symptoms have been present for 4 to 5 days and are causing discomfort and itching.  Unclear etiology of hives.  Symptoms developed just a few days ago.  She denies any exposure to new medications, laundry detergents, lotions or body washes.  Hives are situated around her injection sites.  Per up-to-date, Lovenox injections can cause localized site irritation, pain and hives.  Given patient is pregnant, patient could try an H1 blocker such as Claritin 10 mg daily along with a topical steroid cream.  If symptoms are persistent or worsen, patient may need steroids but this is not recommended until second trimester.  Plan: Continue to rotate Lovenox injection sites.  Rx Claritin 10 mg daily.  Can be found OTC. Can try OTC hydrocortisone first and if no improvement would recommend higher potency steroid cream Kenalog.  Rx Kenalog cream.  Apply up to 3 times a day to areas of concern.  Disposition: We will touch base with her on Tuesday, 06/27/2020 prior to her vacation to make sure symptoms are improving. Will also make an appt for her to see Dr. B in the next few weeks.   Visit Diagnosis 1. Localized hives   2. Protein S deficiency affecting pregnancy [facility])     Patient expressed understanding and was in agreement with this plan. She also understands that She can call clinic at any time with any questions, concerns, or complaints.   I provided 25 minutes of face-to-face video visit time during this encounter, and > 50% was spent counseling as documented under my assessment & plan. Thank you for allowing me to participate in the care of this very pleasant patient.    Mauro Kaufmann, NP [facility] Cell - [phone] Pager- [phone] 06/22/2020 1:59 PM

## 2020-06-22 NOTE — Telephone Encounter (Signed)
Patient called reporting that she is having a reaction to the Lovenox injections. She reports that during the first week, she had no feeling in her "private area", the second week she noticed severe bruising, and the third week she has developed painful hives 5 inches in diameter that are hot to  The touch. She is requesting an appointment in Symptom Management Clinic for this. Please contact patient for appt

## 2020-06-22 NOTE — Telephone Encounter (Signed)
Mychart visit arranged for 1030 am today. Patient reports that the numbness in "private area" resolved. This occurred approximately 3 weeks ago. "every week I have a new symptom. The second week, I had bruising at the injection site. This week I have hives that itch around the injection site." patient using cold compresses at site. Has not used any antihistamines or OTC interventions for itching. Pt prefers mychart visit if possible as she is currently at work. Patient accepted apt at 10:30 with Boneta Lucks, NP

## 2020-06-30 DIAGNOSIS — Z3A12 12 weeks gestation of pregnancy: Secondary | ICD-10-CM | POA: Diagnosis not present

## 2020-06-30 DIAGNOSIS — O09521 Supervision of elderly multigravida, first trimester: Secondary | ICD-10-CM | POA: Diagnosis not present

## 2020-07-04 ENCOUNTER — Ambulatory Visit: Payer: BC Managed Care – PPO | Admitting: Internal Medicine

## 2020-07-11 DIAGNOSIS — O09522 Supervision of elderly multigravida, second trimester: Secondary | ICD-10-CM | POA: Diagnosis not present

## 2020-07-11 DIAGNOSIS — Z3A14 14 weeks gestation of pregnancy: Secondary | ICD-10-CM | POA: Diagnosis not present

## 2020-07-12 ENCOUNTER — Other Ambulatory Visit: Payer: Self-pay

## 2020-07-12 ENCOUNTER — Inpatient Hospital Stay: Payer: BC Managed Care – PPO | Attending: Internal Medicine | Admitting: Internal Medicine

## 2020-07-12 ENCOUNTER — Encounter: Payer: Self-pay | Admitting: Internal Medicine

## 2020-07-12 DIAGNOSIS — O99712 Diseases of the skin and subcutaneous tissue complicating pregnancy, second trimester: Secondary | ICD-10-CM | POA: Diagnosis not present

## 2020-07-12 DIAGNOSIS — Z3A16 16 weeks gestation of pregnancy: Secondary | ICD-10-CM | POA: Insufficient documentation

## 2020-07-12 DIAGNOSIS — O99119 Other diseases of the blood and blood-forming organs and certain disorders involving the immune mechanism complicating pregnancy, unspecified trimester: Secondary | ICD-10-CM | POA: Diagnosis not present

## 2020-07-12 DIAGNOSIS — L309 Dermatitis, unspecified: Secondary | ICD-10-CM | POA: Diagnosis not present

## 2020-07-12 DIAGNOSIS — D6859 Other primary thrombophilia: Secondary | ICD-10-CM | POA: Diagnosis not present

## 2020-07-12 DIAGNOSIS — O99112 Other diseases of the blood and blood-forming organs and certain disorders involving the immune mechanism complicating pregnancy, second trimester: Secondary | ICD-10-CM | POA: Diagnosis not present

## 2020-07-12 DIAGNOSIS — Z7901 Long term (current) use of anticoagulants: Secondary | ICD-10-CM | POA: Insufficient documentation

## 2020-07-12 NOTE — Assessment & Plan Note (Addendum)
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  Repeat testing-March 2021-protein S total antigen- 51%; protein S activity 39%.  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   #Patient currently [redacted] weeks pregnant; tolerating Lovenox with mild to moderate difficulties-see below.  Continue Lovenox until 24 hours prior to delivery.  # ?  Dermatitis/allergy to Lovenox-currently improved on Claritin.  However if concerns of allergy-would recommend dalteparin as an alternative.  # DISPOSITION: # follow up in 3 months- MD; labs- cbc/bmp/Iron studies/ferritin-Dr.B

## 2020-07-12 NOTE — Progress Notes (Signed)
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  Prothrombin gene mutation/factor V Leiden; protein S; MTHFR- normal.  Antithrombin 3 antigen; anticardiolipin antibody IgM-9; beta-2 glycoprotein antibody IgG IgM- normal; lupus anticoagulant DRV VT-normal; PTT PT normal;  Repeat testing-March 2021-protein S total antigen- 51%; protein S activity 39%; mid June 2021-Lovenox 150 mg SQ  #  2 miscarriages one in July and again in December 2020; No Hx of DV/PE  Oncology History   No history exists.     HISTORY OF PRESENTING ILLNESS:  [name] 39 y.o.  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  Patient is currently on Lovenox since mid June 2021.    Patient had hives/allergy-like reaction at the site of Lovenox injections.  Patient has been evaluated by symptomatic clinic currently on Claritin.   Noted improvement of her hive-like reactions-mild itching currently.   Review of Systems  Constitutional: Negative for chills, diaphoresis, fever, malaise/fatigue and weight loss.  HENT: Negative for nosebleeds and sore throat.   Eyes: Negative for double vision.  Respiratory: Negative for cough, hemoptysis, sputum production, shortness of breath and wheezing.   Cardiovascular: Negative for chest pain, palpitations, orthopnea and leg swelling.  Gastrointestinal: Negative for abdominal pain, blood in stool, constipation, diarrhea, heartburn, melena, nausea and vomiting.  Genitourinary: Negative for dysuria, frequency and urgency.  Musculoskeletal: Negative for back pain and joint pain.  Skin: Positive for itching and rash.  Neurological: Negative for dizziness, tingling, focal weakness, weakness and headaches.   Endo/Heme/Allergies: Does not bruise/bleed easily.  Psychiatric/Behavioral: Negative for depression. The patient is nervous/anxious. The patient does not have insomnia.      MEDICAL HISTORY:  Past Medical History:  Diagnosis Date  . History of chicken pox   . History of miscarriage   . Protein S deficiency (HCC)     SURGICAL HISTORY: Past Surgical History:  Procedure Laterality Date  . CESAREAN SECTION      SOCIAL HISTORY: Social History   Socioeconomic History  . Marital status: Single    Spouse name: Not on file  . Number of children: Not on file  . Years of education: Not on file  . Highest education level: Not on file  Occupational History  . Not on file  Tobacco Use  . Smoking status: Never Smoker  . Smokeless tobacco: Never Used  Substance and Sexual Activity  . Alcohol use: Yes    Comment: occasional  . Drug use: No  . Sexual activity: Yes    Birth control/protection: Condom  Other Topics Concern  . 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    Social Determinants of Health   Financial Resource Strain:   . Difficulty of Paying Living Expenses:   Food Insecurity:   . Worried About Programme researcher, broadcasting/film/video in the Last Year:   . Barista in the Last Year:   Transportation Needs:   . Freight forwarder (Medical):   Marland Kitchen Lack of Transportation (Non-Medical):   Physical Activity:   . Days of Exercise per Week:   . Minutes of Exercise per Session:   Stress:   . Feeling of Stress :   Social Connections:   .  Frequency of Communication with Friends and Family:   . Frequency of Social Gatherings with Friends and Family:   . Attends Religious Services:   . Active Member of Clubs or Organizations:   . Attends Banker Meetings:   Marland Kitchen Marital Status:   Intimate Partner Violence:   . Fear of Current or Ex-Partner:   . Emotionally Abused:   Marland Kitchen Physically Abused:   .  Sexually Abused:     FAMILY HISTORY: Family History  Problem Relation Age of Onset  . Cancer Mother   . Cancer Father     ALLERGIES:  has No Known Allergies.  MEDICATIONS:  Current Outpatient Medications  Medication Sig Dispense Refill  . enoxaparin (LOVENOX) 150 MG/ML injection Inject 1 mL (150 mg total) into the skin daily. 30 mL 1  . ferrous sulfate 325 (65 FE) MG tablet Take 1 tablet (325 mg total) by mouth daily with breakfast. 30 tablet 1  . loratadine (CLARITIN) 10 MG tablet Take 1 tablet (10 mg total) by mouth daily. 90 tablet 2  . Prenatal Vit-Fe Fumarate-FA (MULTIVITAMIN-PRENATAL) 27-0.8 MG TABS tablet Take 1 tablet by mouth daily at 12 noon.    . vitamin B-12 (CYANOCOBALAMIN) 1000 MCG tablet Take 1,000 mcg by mouth daily.    Marland Kitchen triamcinolone ointment (KENALOG) 0.5 % Apply 1 application topically 3 (three) times daily. (Patient not taking: Reported on 07/12/2020) 30 g 1   No current facility-administered medications for this visit.   PHYSICAL EXAMINATION:  Vitals:   07/12/20 1437  BP: 116/75  Pulse: (!) 105  Resp: 18  Temp: 98.7 F (37.1 C)  SpO2: 94%   Filed Weights   07/12/20 1437  Weight: 278 lb (126.1 kg)    Physical Exam HENT:     Head: Normocephalic and atraumatic.     Mouth/Throat:     Pharynx: No oropharyngeal exudate.  Eyes:     Pupils: Pupils are equal, round, and reactive to light.  Cardiovascular:     Rate and Rhythm: Normal rate and regular rhythm.  Pulmonary:     Effort: No respiratory distress.     Breath sounds: No wheezing.  Abdominal:     General: Bowel sounds are normal. There is no distension.     Palpations: Abdomen is soft. There is no mass.     Tenderness: There is no abdominal tenderness. There is no guarding or rebound.  Musculoskeletal:        General: No tenderness. Normal range of motion.     Cervical back: Normal range of motion and neck supple.  Skin:    General: Skin is warm.     Comments: Wheals noted on the abdomen  at the site of Lovenox injections.  Neurological:     Mental Status: She is alert and oriented to person, place, and time.  Psychiatric:        Mood and Affect: Affect normal.      LABORATORY DATA:  I have reviewed the data as listed Lab Results  Component Value Date   WBC 13.8 (H) 03/22/2020   HGB 10.3 (L) 03/22/2020   HCT 33.7 (L) 03/22/2020   MCV 66.3 (L) 03/22/2020   PLT 345 03/22/2020   Recent Labs    03/22/20 1456  NA 135  K 3.8  CL 104  CO2 24  GLUCOSE 116*  BUN 10  CREATININE 0.96  CALCIUM 8.6*  GFRNONAA >60  GFRAA >60  PROT 7.6  ALBUMIN 3.6  AST 13*  ALT 11  ALKPHOS 98  BILITOT 0.2*    RADIOGRAPHIC STUDIES: I have personally reviewed the radiological images as listed and agreed with the findings in the report. No results found.  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  Repeat testing-March 2021-protein S total antigen- 51%; protein S activity 39%.  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   #Patient currently [redacted] weeks pregnant; tolerating Lovenox with mild to moderate difficulties-see below.  Continue Lovenox until 24 hours prior to delivery.  # ?  Dermatitis/allergy to Lovenox-currently improved on Claritin.  However if concerns of allergy-would recommend dalteparin as an alternative.  # DISPOSITION: # follow up in 3 months- MD; labs- cbc/bmp/Iron studies/ferritin-Dr.B   All questions were answered. The patient knows to call the clinic with any problems, questions or concerns.    Earna Coder, MD 07/12/2020 8:24 PM

## 2020-07-24 DIAGNOSIS — Z361 Encounter for antenatal screening for raised alphafetoprotein level: Secondary | ICD-10-CM | POA: Diagnosis not present

## 2020-07-24 DIAGNOSIS — Z3A16 16 weeks gestation of pregnancy: Secondary | ICD-10-CM | POA: Diagnosis not present

## 2020-07-24 DIAGNOSIS — Z3A19 19 weeks gestation of pregnancy: Secondary | ICD-10-CM | POA: Diagnosis not present

## 2020-07-24 DIAGNOSIS — O26892 Other specified pregnancy related conditions, second trimester: Secondary | ICD-10-CM | POA: Diagnosis not present

## 2020-07-24 DIAGNOSIS — O09522 Supervision of elderly multigravida, second trimester: Secondary | ICD-10-CM | POA: Diagnosis not present

## 2020-07-24 DIAGNOSIS — O3680X0 Pregnancy with inconclusive fetal viability, not applicable or unspecified: Secondary | ICD-10-CM | POA: Diagnosis not present

## 2020-07-24 DIAGNOSIS — O3412 Maternal care for benign tumor of corpus uteri, second trimester: Secondary | ICD-10-CM | POA: Diagnosis not present

## 2020-07-24 DIAGNOSIS — N898 Other specified noninflammatory disorders of vagina: Secondary | ICD-10-CM | POA: Diagnosis not present

## 2020-07-24 DIAGNOSIS — O3662X0 Maternal care for excessive fetal growth, second trimester, not applicable or unspecified: Secondary | ICD-10-CM | POA: Diagnosis not present

## 2020-07-24 DIAGNOSIS — Z3480 Encounter for supervision of other normal pregnancy, unspecified trimester: Secondary | ICD-10-CM | POA: Diagnosis not present

## 2020-08-09 ENCOUNTER — Other Ambulatory Visit: Payer: Self-pay | Admitting: Internal Medicine

## 2020-08-09 ENCOUNTER — Other Ambulatory Visit: Payer: Self-pay | Admitting: *Deleted

## 2020-08-09 NOTE — Telephone Encounter (Signed)
No dose changes recommended; continue current dose. GB

## 2020-08-09 NOTE — Telephone Encounter (Signed)
Dr. Leonard Schwartz - do we need to make any dosage changes due to pregnancy weight change?

## 2020-08-14 DIAGNOSIS — O09522 Supervision of elderly multigravida, second trimester: Secondary | ICD-10-CM | POA: Diagnosis not present

## 2020-08-14 DIAGNOSIS — Z3A19 19 weeks gestation of pregnancy: Secondary | ICD-10-CM | POA: Diagnosis not present

## 2020-09-01 DIAGNOSIS — Z3A21 21 weeks gestation of pregnancy: Secondary | ICD-10-CM | POA: Diagnosis not present

## 2020-09-01 DIAGNOSIS — Z362 Encounter for other antenatal screening follow-up: Secondary | ICD-10-CM | POA: Diagnosis not present

## 2020-09-01 DIAGNOSIS — O09522 Supervision of elderly multigravida, second trimester: Secondary | ICD-10-CM | POA: Diagnosis not present

## 2020-09-25 DIAGNOSIS — O09522 Supervision of elderly multigravida, second trimester: Secondary | ICD-10-CM | POA: Diagnosis not present

## 2020-09-25 DIAGNOSIS — Z3A25 25 weeks gestation of pregnancy: Secondary | ICD-10-CM | POA: Diagnosis not present

## 2020-10-11 ENCOUNTER — Inpatient Hospital Stay: Payer: BC Managed Care – PPO

## 2020-10-11 ENCOUNTER — Inpatient Hospital Stay: Payer: BC Managed Care – PPO | Admitting: Internal Medicine

## 2020-10-16 ENCOUNTER — Inpatient Hospital Stay: Payer: BC Managed Care – PPO | Attending: Internal Medicine

## 2020-10-16 ENCOUNTER — Inpatient Hospital Stay (HOSPITAL_BASED_OUTPATIENT_CLINIC_OR_DEPARTMENT_OTHER): Payer: BC Managed Care – PPO | Admitting: Internal Medicine

## 2020-10-16 ENCOUNTER — Other Ambulatory Visit: Payer: Self-pay

## 2020-10-16 ENCOUNTER — Encounter: Payer: Self-pay | Admitting: Internal Medicine

## 2020-10-16 DIAGNOSIS — Z7901 Long term (current) use of anticoagulants: Secondary | ICD-10-CM | POA: Diagnosis not present

## 2020-10-16 DIAGNOSIS — O99119 Other diseases of the blood and blood-forming organs and certain disorders involving the immune mechanism complicating pregnancy, unspecified trimester: Secondary | ICD-10-CM | POA: Diagnosis not present

## 2020-10-16 DIAGNOSIS — D6859 Other primary thrombophilia: Secondary | ICD-10-CM | POA: Insufficient documentation

## 2020-10-16 DIAGNOSIS — D509 Iron deficiency anemia, unspecified: Secondary | ICD-10-CM | POA: Diagnosis not present

## 2020-10-16 DIAGNOSIS — O99113 Other diseases of the blood and blood-forming organs and certain disorders involving the immune mechanism complicating pregnancy, third trimester: Secondary | ICD-10-CM | POA: Insufficient documentation

## 2020-10-16 DIAGNOSIS — O99013 Anemia complicating pregnancy, third trimester: Secondary | ICD-10-CM | POA: Diagnosis not present

## 2020-10-16 LAB — BASIC METABOLIC PANEL
Anion gap: 7 (ref 5–15)
BUN: 8 mg/dL (ref 6–20)
CO2: 22 mmol/L (ref 22–32)
Calcium: 8.1 mg/dL — ABNORMAL LOW (ref 8.9–10.3)
Chloride: 105 mmol/L (ref 98–111)
Creatinine, Ser: 0.78 mg/dL (ref 0.44–1.00)
GFR, Estimated: >60 mL/min (ref >60)
Glucose, Bld: 129 mg/dL — ABNORMAL HIGH (ref 70–99)
Potassium: 4 mmol/L (ref 3.5–5.1)
Sodium: 134 mmol/L — ABNORMAL LOW (ref 135–145)

## 2020-10-16 LAB — CBC WITH DIFFERENTIAL/PLATELET
Abs Immature Granulocytes: 0.12 10*3/uL — ABNORMAL HIGH (ref 0.00–0.07)
Basophils Absolute: 0 10*3/uL (ref 0.0–0.1)
Basophils Relative: 0 %
Eosinophils Absolute: 0.2 10*3/uL (ref 0.0–0.5)
Eosinophils Relative: 1 %
HCT: 29.5 % — ABNORMAL LOW (ref 36.0–46.0)
Hemoglobin: 9.4 g/dL — ABNORMAL LOW (ref 12.0–15.0)
Immature Granulocytes: 1 %
Lymphocytes Relative: 14 %
Lymphs Abs: 2 10*3/uL (ref 0.7–4.0)
MCH: 22.9 pg — ABNORMAL LOW (ref 26.0–34.0)
MCHC: 31.9 g/dL (ref 30.0–36.0)
MCV: 72 fL — ABNORMAL LOW (ref 80.0–100.0)
Monocytes Absolute: 0.5 10*3/uL (ref 0.1–1.0)
Monocytes Relative: 4 %
Neutro Abs: 11.4 10*3/uL — ABNORMAL HIGH (ref 1.7–7.7)
Neutrophils Relative %: 80 %
Platelets: 235 10*3/uL (ref 150–400)
RBC: 4.1 MIL/uL (ref 3.87–5.11)
RDW: 18.9 % — ABNORMAL HIGH (ref 11.5–15.5)
WBC: 14.3 10*3/uL — ABNORMAL HIGH (ref 4.0–10.5)
nRBC: 0 % (ref 0.0–0.2)

## 2020-10-16 LAB — IRON AND TIBC
Iron: 27 ug/dL — ABNORMAL LOW (ref 28–170)
Saturation Ratios: 5 % — ABNORMAL LOW (ref 10.4–31.8)
TIBC: 496 ug/dL — ABNORMAL HIGH (ref 250–450)
UIBC: 469 ug/dL

## 2020-10-16 LAB — FERRITIN: Ferritin: 10 ng/mL — ABNORMAL LOW (ref 11–307)

## 2020-10-16 NOTE — Patient Instructions (Signed)
#   calcium 500 + vit D 800 mg twice a day.

## 2020-10-16 NOTE — Progress Notes (Signed)
[facility] CONSULT NOTE  Patient Care Team: Patient, No Pcp Per as PCP - General (General Practice)  CHIEF COMPLAINTS/PURPOSE OF CONSULTATION: DVT/PE  # JAN 2021 [[facility]; JAN 2021 ]Protein S deficiency-48% [60 to 150]; free protein S 39% [57-157].  Prothrombin gene mutation/factor V Leiden; protein S; MTHFR- normal.  Antithrombin 3 antigen; anticardiolipin antibody IgM-9; beta-2 glycoprotein antibody IgG IgM- normal; lupus anticoagulant DRV VT-normal; PTT PT normal;  Repeat testing-March 2021-protein S total antigen- 51%; protein S activity 39%; mid June 2021-Lovenox 150 mg SQ  #November 2021-third trimester-iron deficient anemia; IV Venofer.  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     HISTORY OF PRESENTING ILLNESS:  [name] 39 y.o.  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  Patient is currently on Lovenox since mid June 2021.  Patient denies any further episodes of itching or hives after Lovenox injections.  Patient complains of fatigue.  Complains of cramping in the legs especially right side.  No swelling in the legs.  Patient not on p.o. iron because of history of constipation.  She does take multivitamin.  Review of Systems  Constitutional: Negative for chills, diaphoresis, fever, malaise/fatigue and weight loss.  HENT: Negative for nosebleeds and sore throat.   Eyes: Negative for double vision.  Respiratory: Negative for cough, hemoptysis, sputum production, shortness of breath and wheezing.   Cardiovascular: Negative for chest pain, palpitations, orthopnea and leg swelling.  Gastrointestinal: Negative for abdominal pain, blood in stool, constipation, diarrhea, heartburn, melena, nausea and vomiting.  Genitourinary: Negative for dysuria, frequency and urgency.  Musculoskeletal: Negative for back  pain and joint pain.  Skin: Positive for itching and rash.  Neurological: Negative for dizziness, tingling, focal weakness, weakness and headaches.  Endo/Heme/Allergies: Does not bruise/bleed easily.  Psychiatric/Behavioral: Negative for depression. The patient is nervous/anxious. The patient does not have insomnia.      MEDICAL HISTORY:  Past Medical History:  Diagnosis Date  . History of chicken pox   . History of miscarriage   . Protein S deficiency (HCC)     SURGICAL HISTORY: Past Surgical History:  Procedure Laterality Date  . CESAREAN SECTION      SOCIAL HISTORY: Social History   Socioeconomic History  . Marital status: Single    Spouse name: Not on file  . Number of children: Not on file  . Years of education: Not on file  . Highest education level: Not on file  Occupational History  . Not on file  Tobacco Use  . Smoking status: Never Smoker  . Smokeless tobacco: Never Used  Substance and Sexual Activity  . Alcohol use: Yes    Comment: occasional  . Drug use: No  . Sexual activity: Yes    Birth control/protection: Condom  Other Topics Concern  . 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    Social Determinants of Health   Financial Resource Strain:   . Difficulty of Paying Living Expenses: Not on file  Food Insecurity:   . Worried About Programme researcher, broadcasting/film/video in the Last Year: Not on file  . Ran Out of Food in the Last Year: Not on file  Transportation Needs:   . Lack of Transportation (Medical): Not on file  . Lack of Transportation (Non-Medical): Not  on file  Physical Activity:   . Days of Exercise per Week: Not on file  . Minutes of Exercise per Session: Not on file  Stress:   . Feeling of Stress : Not on file  Social Connections:   . Frequency of Communication with Friends and Family: Not on file  . Frequency of Social Gatherings with Friends and Family: Not on file  .  Attends Religious Services: Not on file  . Active Member of Clubs or Organizations: Not on file  . Attends Banker Meetings: Not on file  . Marital Status: Not on file  Intimate Partner Violence:   . Fear of Current or Ex-Partner: Not on file  . Emotionally Abused: Not on file  . Physically Abused: Not on file  . Sexually Abused: Not on file    FAMILY HISTORY: Family History  Problem Relation Age of Onset  . Cancer Mother   . Cancer Father     ALLERGIES:  has No Known Allergies.  MEDICATIONS:  Current Outpatient Medications  Medication Sig Dispense Refill  . enoxaparin (LOVENOX) 150 MG/ML injection INJECT 1 ML (150 MG TOTAL) INTO THE SKIN DAILY. 30 mL 2  . ferrous sulfate 325 (65 FE) MG tablet Take 1 tablet (325 mg total) by mouth daily with breakfast. 30 tablet 1  . Prenatal Vit-Fe Fumarate-FA (MULTIVITAMIN-PRENATAL) 27-0.8 MG TABS tablet Take 1 tablet by mouth daily at 12 noon.    . triamcinolone ointment (KENALOG) 0.5 % Apply 1 application topically 3 (three) times daily. (Patient not taking: Reported on 07/12/2020) 30 g 1   No current facility-administered medications for this visit.   PHYSICAL EXAMINATION:  Vitals:   10/16/20 1546  BP: 125/66  Pulse: (!) 102  Resp: 18  Temp: (!) 97.2 F (36.2 C)  SpO2: 100%   Filed Weights   10/16/20 1546  Weight: 282 lb (127.9 kg)    Physical Exam HENT:     Head: Normocephalic and atraumatic.     Mouth/Throat:     Pharynx: No oropharyngeal exudate.  Eyes:     Pupils: Pupils are equal, round, and reactive to light.  Cardiovascular:     Rate and Rhythm: Normal rate and regular rhythm.  Pulmonary:     Effort: No respiratory distress.     Breath sounds: No wheezing.  Abdominal:     General: Bowel sounds are normal. There is no distension.     Palpations: Abdomen is soft. There is no mass.     Tenderness: There is no abdominal tenderness. There is no guarding or rebound.  Musculoskeletal:        General: No  tenderness. Normal range of motion.     Cervical back: Normal range of motion and neck supple.  Skin:    General: Skin is warm.     Comments: Wheals noted on the abdomen at the site of Lovenox injections.  Neurological:     Mental Status: She is alert and oriented to person, place, and time.  Psychiatric:        Mood and Affect: Affect normal.      LABORATORY DATA:  I have reviewed the data as listed Lab Results  Component Value Date   WBC 14.3 (H) 10/16/2020   HGB 9.4 (L) 10/16/2020   HCT 29.5 (L) 10/16/2020   MCV 72.0 (L) 10/16/2020   PLT 235 10/16/2020   Recent Labs    03/22/20 1456 10/16/20 1527  NA 135 134*  K 3.8 4.0  CL 104  105  CO2 24 22  GLUCOSE 116* 129*  BUN 10 8  CREATININE 0.96 0.78  CALCIUM 8.6* 8.1*  GFRNONAA >60 >60  GFRAA >60  --   PROT 7.6  --   ALBUMIN 3.6  --   AST 13*  --   ALT 11  --   ALKPHOS 98  --   BILITOT 0.2*  --     RADIOGRAPHIC STUDIES: I have personally reviewed the radiological images as listed and agreed with the findings in the report. No results found.  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  Repeat testing-March 2021-protein S total antigen- 51%; protein S activity 39%.  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   # Continue anticoagulation until 24 hours prior to delivery;plan to switch to HEparin SQ [OB]  At 36 weeks [epidural] EDD- Jan 2nd 2022.  Patient understands that she will switch over to Lovenox partum for 6 weeks.  # Anemia- Hb 9; awaiting Irons studies; If extremely low would recommend IV iron [PO iron constipation]; Discussed the potential acute infusion reactions with IV iron; which are quite rare.  Patient understands the risk; will proceed with infusions.  #Hypocalcemia-calcium 8.1/ right LE cramping vit D ca+ vit D 500/800 BID.   # DISPOSITION: # follow up week of mid December 2021- MD;  labs- cbc/bmp--Dr.B  Addendum: Iron studies show-iron deficiency saturation 5%; recommend IV iron infusion.  Discussed with patient she is agreement.   All questions were answered. The patient knows to call the clinic with any problems, questions or concerns.    Earna Coder, MD 10/17/2020 2:27 PM

## 2020-10-16 NOTE — Assessment & Plan Note (Addendum)
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  Repeat testing-March 2021-protein S total antigen- 51%; protein S activity 39%.  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   # Continue anticoagulation until 24 hours prior to delivery;plan to switch to HEparin SQ [OB]  At 36 weeks [epidural] EDD- Jan 2nd 2022.  Patient understands that she will switch over to Lovenox partum for 6 weeks.  # Anemia- Hb 9; awaiting Irons studies; If extremely low would recommend IV iron [PO iron constipation]; Discussed the potential acute infusion reactions with IV iron; which are quite rare.  Patient understands the risk; will proceed with infusions.  #Hypocalcemia-calcium 8.1/ right LE cramping vit D ca+ vit D 500/800 BID.   # DISPOSITION: # follow up week of mid December 2021- MD; labs- cbc/bmp--Dr.B  Addendum: Iron studies show-iron deficiency saturation 5%; recommend IV iron infusion.  Discussed with patient she is agreement.

## 2020-10-16 NOTE — Progress Notes (Signed)
Pt in for follow up, reports tires and becomes short of breath easily.  Reports sleeping a lot more.

## 2020-10-17 ENCOUNTER — Telehealth: Payer: Self-pay | Admitting: Internal Medicine

## 2020-10-17 DIAGNOSIS — O99013 Anemia complicating pregnancy, third trimester: Secondary | ICD-10-CM | POA: Insufficient documentation

## 2020-10-17 NOTE — Telephone Encounter (Signed)
On 10/25-spoke to patient regarding results of iron levels.  Severely iron deficient/second pregnancy.  Recommend IV iron infusions.  Patient is interested.  C-Venofer weekly x3 ; start next week.   #Schedule IV iron infusion at next visit with MD in dec 2021.

## 2020-10-18 DIAGNOSIS — O09523 Supervision of elderly multigravida, third trimester: Secondary | ICD-10-CM | POA: Diagnosis not present

## 2020-10-18 DIAGNOSIS — Z3689 Encounter for other specified antenatal screening: Secondary | ICD-10-CM | POA: Diagnosis not present

## 2020-10-25 ENCOUNTER — Inpatient Hospital Stay: Payer: BC Managed Care – PPO | Attending: Internal Medicine

## 2020-11-01 ENCOUNTER — Inpatient Hospital Stay: Payer: BC Managed Care – PPO

## 2020-11-02 ENCOUNTER — Other Ambulatory Visit: Payer: Self-pay | Admitting: Internal Medicine

## 2020-11-08 ENCOUNTER — Inpatient Hospital Stay: Payer: BC Managed Care – PPO

## 2020-11-30 ENCOUNTER — Encounter (HOSPITAL_COMMUNITY): Payer: Self-pay | Admitting: Obstetrics and Gynecology

## 2020-11-30 ENCOUNTER — Inpatient Hospital Stay (HOSPITAL_BASED_OUTPATIENT_CLINIC_OR_DEPARTMENT_OTHER): Payer: BC Managed Care – PPO

## 2020-11-30 ENCOUNTER — Other Ambulatory Visit: Payer: Self-pay

## 2020-11-30 ENCOUNTER — Inpatient Hospital Stay (HOSPITAL_COMMUNITY)
Admission: AD | Admit: 2020-11-30 | Discharge: 2020-11-30 | Disposition: A | Discharge status: Home / Self Care | Payer: BC Managed Care – PPO | Attending: Obstetrics and Gynecology | Admitting: Obstetrics and Gynecology

## 2020-11-30 DIAGNOSIS — O09523 Supervision of elderly multigravida, third trimester: Secondary | ICD-10-CM | POA: Insufficient documentation

## 2020-11-30 DIAGNOSIS — R102 Pelvic and perineal pain: Secondary | ICD-10-CM

## 2020-11-30 DIAGNOSIS — Z3A34 34 weeks gestation of pregnancy: Secondary | ICD-10-CM | POA: Insufficient documentation

## 2020-11-30 DIAGNOSIS — O26893 Other specified pregnancy related conditions, third trimester: Secondary | ICD-10-CM

## 2020-11-30 DIAGNOSIS — O34219 Maternal care for unspecified type scar from previous cesarean delivery: Secondary | ICD-10-CM | POA: Diagnosis not present

## 2020-11-30 DIAGNOSIS — O26892 Other specified pregnancy related conditions, second trimester: Secondary | ICD-10-CM

## 2020-11-30 HISTORY — DX: Anemia, unspecified: D64.9

## 2020-11-30 LAB — URINALYSIS, ROUTINE W REFLEX MICROSCOPIC
Bacteria, UA: NONE SEEN
Bilirubin Urine: NEGATIVE
Glucose, UA: NEGATIVE mg/dL
Ketones, ur: NEGATIVE mg/dL
Nitrite: NEGATIVE
Protein, ur: 30 mg/dL — AB
Specific Gravity, Urine: 1.024 (ref 1.005–1.030)
pH: 6 (ref 5.0–8.0)

## 2020-11-30 NOTE — Progress Notes (Signed)
EFM tracing sketchy @ [facility]

## 2020-11-30 NOTE — MAU Note (Signed)
.   [name] is a 39 y.o. here in MAU reporting: leg pain that started x2 days ago. She states that it also hurts in her pelvis. No VB or LOF. Endorses good fetal movement.   Pain score: 9 Vitals:   11/30/20 0916  BP: 140/65  Pulse: (!) 102  Resp: 17  Temp: 98.8 F (37.1 C)     FHT:145

## 2020-11-30 NOTE — MAU Provider Note (Signed)
History     Chief Complaint  Patient presents with   Leg Pain     39 yo G2P1001 BF hx C/S now @ 34 4/[redacted] wk gestation presents with c/o difficulty walking due to pain in Lower abdomen/pelvic area. (+) pelvic pressure and the sense that " something is wrong". Pain is sharp Nonradiating. (+)sharp pains in the vaginal area. Denies urinary sx. (+) FM . No leakage of fluid, vaginal bleeding Has had back through through pregnancy and with last pregnancy. Protein S deficiency on lovenox. Prior hx C/S. Plans  Repeat C/S. Unvaccinated Covid pt  OB History     Gravida  2   Para  1   Term  1   Preterm      AB      Living  1      SAB      IAB      Ectopic      Multiple      Live Births  1           Past Medical History:  Diagnosis Date   Anemia    History of chicken pox    History of miscarriage    Protein S deficiency (HCC)     Past Surgical History:  Procedure Laterality Date   CESAREAN SECTION      Family History  Problem Relation Age of Onset   Cancer Father    Cancer Sister     Social History   Tobacco Use   Smoking status: Never Smoker   Smokeless tobacco: Never Used  Vaping Use   Vaping Use: Never used  Substance Use Topics   Alcohol use: Yes    Comment: occasional   Drug use: No    Allergies: No Known Allergies  Medications Prior to Admission  Medication Sig Dispense Refill Last Dose   enoxaparin (LOVENOX) 150 MG/ML injection INJECT 1 ML (150 MG TOTAL) INTO THE SKIN DAILY. 30 mL 1 11/30/2020 at Unknown time   ferrous sulfate 325 (65 FE) MG tablet Take 1 tablet (325 mg total) by mouth daily with breakfast. 30 tablet 1 11/30/2020 at Unknown time   Prenatal Vit-Fe Fumarate-FA (MULTIVITAMIN-PRENATAL) 27-0.8 MG TABS tablet Take 1 tablet by mouth daily at 12 noon.   11/29/2020 at Unknown time   triamcinolone ointment (KENALOG) 0.5 % Apply 1 application topically 3 (three) times daily. (Patient not taking: No sig reported) 30 g 1 More than a month at  Unknown time     Physical Exam   Blood pressure 117/65, pulse 100, temperature 98.5 F (36.9 C), temperature source Oral, resp. rate 18, height 5\' 6"  (1.676 m), weight 128.1 kg, SpO2 100 %.  BP 117/65 (BP Location: Right Arm)   Pulse 100   Temp 98.5 F (36.9 C) (Oral)   Resp 18   Ht 5\' 6"  (1.676 m)   Wt 128.1 kg   SpO2 100%   BMI 45.58 kg/m  Lungs: clear to auscultation bilaterally Heart: regular rate and rhythm, S1, S2 normal, no murmur, click, rub or gallop Abdomen:  soft gravid tender to palp lower abdomen. pfannensteil scar Pelvic: external genitalia normal and cervix long closed OOP Extremities: edema +1    Tracing: baseline 140 (+) accel  No ctx ED Course  Pelvic pain in pregnancy Previous Cesarean section IUP @ 34 4/7 weeks, C/S scar site  Protein S deficiency on lovenox P) OB sono check placenta,  MDM  Addendum: MFM OB LIMITED  Result Date: 11/30/2020 ----------------------------------------------------------------------  OBSTETRICS REPORT                       (Signed Final 11/30/2020 03:20 pm) ---------------------------------------------------------------------- Patient Info  ID #:       [id number]                          D.O.B.:  [birth year] (39 yrs)  Name:       [name]               Visit Date: 11/30/2020 10:50 am ---------------------------------------------------------------------- Performed By  Attending:        Lin Landsman      Referred By:      [facility] MAU/Triage                    MD  Performed By:     Hurman Horn          [facility] ---------------------------------------------------------------------- Orders  #  Description                           Code        Ordered By  1  [place] MFM OB LIMITED                     [phone]    Jairen Goldfarb  ----------------------------------------------------------------------  #  Order #                     Accession #                Episode #  1  [id number]                   [id number]                 [id number] ---------------------------------------------------------------------- Indications  [redacted] weeks gestation of pregnancy                Z3A.34  Pelvic pain affecting pregnancy in third       O26.893  trimester  Advanced maternal age multigravida (31),       O09.523  third trimester  Previous cesarean delivery, antepartum         O34.219 ---------------------------------------------------------------------- Fetal Evaluation  Num Of Fetuses:         1  Fetal Heart Rate(bpm):  141  Cardiac Activity:       Observed  Presentation:           Cephalic  Placenta:               Anterior  P. Cord Insertion:      Visualized, central  Amniotic Fluid  AFI FV:  Within normal limits  AFI Sum(cm)     %Tile       Largest Pocket(cm)  14.7            53          4.9  RUQ(cm)       RLQ(cm)       LUQ(cm)        LLQ(cm)  4.3           3.5           4.9            2  Comment:    No placental abruption or previa identified. ---------------------------------------------------------------------- OB History  Gravidity:    2         Term:   1 ---------------------------------------------------------------------- Gestational Age  LMP:           34w 4d        Date:  04/02/20                 EDD:   01/07/21  Best:          34w 4d     Det. By:  LMP  (04/02/20)          EDD:   01/07/21 ---------------------------------------------------------------------- Impression  Limited exam to assess abdominal pain  There is good fetal movement and amniotic fluid  There is no evidence of placental previa or placenta previa ---------------------------------------------------------------------- Recommendations  Given Ms. Bilyk age of 67 consider serial growth exams  and initiate weekly testing at 36 weeks.  ----------------------------------------------------------------------               Lin Landsman, MD Electronically Signed Final Report   11/30/2020 03:20 pm ----------------------------------------------------------------------   Pain related to vaginal spasm  and usuall musculoskeletal pain in pregnancy Pt had declined muscle relaxants or PT referral in the past D/c home  Keep scheduled OB appt Serita Kyle, MD 10:41 AM 11/30/2020

## 2020-11-30 NOTE — Discharge Instructions (Signed)
Pelvic Pain, Female Pelvic pain is pain in your lower belly (abdomen), below your belly button and between your hips. The pain may start suddenly (be acute), keep coming back (be recurring), or last a long time (become chronic). Pelvic pain that lasts longer than 6 months is called chronic pelvic pain. There are many causes of pelvic pain. Sometimes the cause of pelvic pain is not known. Follow these instructions at home:   Take over-the-counter and prescription medicines only as told by your doctor.  Rest as told by your doctor.  Do not have sex if it hurts.  Keep a journal of your pelvic pain. Write down: ? When the pain started. ? Where the pain is located. ? What seems to make the pain better or worse, such as food or your period (menstrual cycle). ? Any symptoms you have along with the pain.  Keep all follow-up visits as told by your doctor. This is important. Contact a doctor if:  Medicine does not help your pain.  Your pain comes back.  You have new symptoms.  You have unusual discharge or bleeding from your vagina.  You have a fever or chills.  You are having trouble pooping (constipation).  You have blood in your pee (urine) or poop (stool).  Your pee smells bad.  You feel weak or light-headed. Get help right away if:  You have sudden pain that is very bad.  Your pain keeps getting worse.  You have very bad pain and also have any of these symptoms: ? A fever. ? Feeling sick to your stomach (nausea). ? Throwing up (vomiting). ? Being very sweaty.  You pass out (lose consciousness). Summary  Pelvic pain is pain in your lower belly (abdomen), below your belly button and between your hips.  There are many possible causes of pelvic pain.  Keep a journal of your pelvic pain. This information is not intended to replace advice given to you by your health care provider. Make sure you discuss any questions you have with your health care provider. Document  Revised: 05/27/2018 Document Reviewed: 05/27/2018 Elsevier Patient Education  2020 Elsevier Inc.  

## 2020-12-04 ENCOUNTER — Ambulatory Visit: Payer: BC Managed Care – PPO

## 2020-12-04 ENCOUNTER — Ambulatory Visit: Payer: BC Managed Care – PPO | Admitting: Internal Medicine

## 2020-12-04 ENCOUNTER — Other Ambulatory Visit: Payer: BC Managed Care – PPO

## 2020-12-05 ENCOUNTER — Other Ambulatory Visit: Payer: Self-pay | Admitting: Obstetrics and Gynecology

## 2020-12-07 ENCOUNTER — Inpatient Hospital Stay (HOSPITAL_COMMUNITY)
Admission: AD | Admit: 2020-12-07 | Discharge: 2020-12-07 | Disposition: A | Discharge status: Home / Self Care | Payer: BC Managed Care – PPO | Attending: Obstetrics and Gynecology | Admitting: Obstetrics and Gynecology

## 2020-12-07 ENCOUNTER — Other Ambulatory Visit: Payer: Self-pay

## 2020-12-07 ENCOUNTER — Encounter (HOSPITAL_COMMUNITY): Payer: Self-pay | Admitting: Obstetrics and Gynecology

## 2020-12-07 DIAGNOSIS — R519 Headache, unspecified: Secondary | ICD-10-CM | POA: Insufficient documentation

## 2020-12-07 DIAGNOSIS — Z3A35 35 weeks gestation of pregnancy: Secondary | ICD-10-CM | POA: Diagnosis not present

## 2020-12-07 DIAGNOSIS — O09523 Supervision of elderly multigravida, third trimester: Secondary | ICD-10-CM | POA: Diagnosis not present

## 2020-12-07 DIAGNOSIS — M7989 Other specified soft tissue disorders: Secondary | ICD-10-CM | POA: Insufficient documentation

## 2020-12-07 DIAGNOSIS — O26893 Other specified pregnancy related conditions, third trimester: Secondary | ICD-10-CM | POA: Insufficient documentation

## 2020-12-07 LAB — PROTEIN / CREATININE RATIO, URINE
Creatinine, Urine: 144.55 mg/dL
Protein Creatinine Ratio: 0.16 mg/mg{Cre} — ABNORMAL HIGH (ref 0.00–0.15)
Total Protein, Urine: 23 mg/dL

## 2020-12-07 LAB — CBC
HCT: 32.2 % — ABNORMAL LOW (ref 36.0–46.0)
Hemoglobin: 10.1 g/dL — ABNORMAL LOW (ref 12.0–15.0)
MCH: 22.8 pg — ABNORMAL LOW (ref 26.0–34.0)
MCHC: 31.4 g/dL (ref 30.0–36.0)
MCV: 72.7 fL — ABNORMAL LOW (ref 80.0–100.0)
Platelets: 222 10*3/uL (ref 150–400)
RBC: 4.43 MIL/uL (ref 3.87–5.11)
RDW: 17 % — ABNORMAL HIGH (ref 11.5–15.5)
WBC: 11 10*3/uL — ABNORMAL HIGH (ref 4.0–10.5)
nRBC: 0 % (ref 0.0–0.2)

## 2020-12-07 LAB — COMPREHENSIVE METABOLIC PANEL
ALT: 15 U/L (ref 0–44)
AST: 12 U/L — ABNORMAL LOW (ref 15–41)
Albumin: 2.7 g/dL — ABNORMAL LOW (ref 3.5–5.0)
Alkaline Phosphatase: 133 U/L — ABNORMAL HIGH (ref 38–126)
Anion gap: 10 (ref 5–15)
BUN: 7 mg/dL (ref 6–20)
CO2: 21 mmol/L — ABNORMAL LOW (ref 22–32)
Calcium: 9 mg/dL (ref 8.9–10.3)
Chloride: 104 mmol/L (ref 98–111)
Creatinine, Ser: 0.71 mg/dL (ref 0.44–1.00)
GFR, Estimated: >60 mL/min (ref >60)
Glucose, Bld: 83 mg/dL (ref 70–99)
Potassium: 3.8 mmol/L (ref 3.5–5.1)
Sodium: 135 mmol/L (ref 135–145)
Total Bilirubin: 0.4 mg/dL (ref 0.3–1.2)
Total Protein: 6.6 g/dL (ref 6.5–8.1)

## 2020-12-07 LAB — URIC ACID: Uric Acid, Serum: 3.9 mg/dL (ref 2.5–7.1)

## 2020-12-07 NOTE — MAU Note (Signed)
Started with HA on Monday, hasn't gone away.  Started monitoring BP- has been elevated. Taking Tylenol (2 ES q 6 hrs, not really helping). Is better now though.  Denies visual changes, epigastric pain, reports some increase in swelling.  Denies bleeding, LOF, +FM

## 2020-12-07 NOTE — MAU Provider Note (Signed)
History     Cc: headache 39 yo G2P1 BF @35  4/[redacted] weeks gestation presented to office for routine OB care. Pt c/o h/a since Monday( worse that day) but did not call the office. Pt notes headache still present but " lighter" . Denied visual changes or epigastric pain. Leg swelling unchanged (+) FM. sono done in office BPP 8/8, nl fluid. AGA. . BP in office 140/70 Last ate at 12n  OB History     Gravida  2   Para  1   Term  1   Preterm      AB      Living  1      SAB      IAB      Ectopic      Multiple      Live Births  1           Past Medical History:  Diagnosis Date   Anemia    History of chicken pox    History of miscarriage    Protein S deficiency (HCC)     Past Surgical History:  Procedure Laterality Date   CESAREAN SECTION      Family History  Problem Relation Age of Onset   Cancer Father    Cancer Sister     Social History   Tobacco Use   Smoking status: Never Smoker   Smokeless tobacco: Never Used  Vaping Use   Vaping Use: Never used  Substance Use Topics   Alcohol use: Yes    Comment: occasional   Drug use: No    Allergies: No Known Allergies  Medications Prior to Admission  Medication Sig Dispense Refill Last Dose   enoxaparin (LOVENOX) 150 MG/ML injection INJECT 1 ML (150 MG TOTAL) INTO THE SKIN DAILY. 30 mL 1    ferrous sulfate 325 (65 FE) MG tablet Take 1 tablet (325 mg total) by mouth daily with breakfast. 30 tablet 1    Prenatal Vit-Fe Fumarate-FA (MULTIVITAMIN-PRENATAL) 27-0.8 MG TABS tablet Take 1 tablet by mouth daily at 12 noon.      triamcinolone ointment (KENALOG) 0.5 % Apply 1 application topically 3 (three) times daily. (Patient not taking: No sig reported) 30 g 1      Physical Exam   Blood pressure (!) 145/74, pulse 96, temperature 98.4 F (36.9 C), temperature source Oral, resp. rate 18, height 5\' 6"  (1.676 m), weight 128 kg, last menstrual period 04/02/2020, SpO2 98 %.  No exam performed today,  exam done in  office . In office " extr (+) 1 edema VE closed/long/-3  Tracing: baseline   145-150 (+) accel  (+) ctx  ED Course  IMP:  Headache in preg IUP@ 35 4/7 week Previous C/.S AMA P) PIH labs.  MDM  Addendum Labs reviewed: CBC: hgb 10.1 Hct 32.2  plt 222K CMP: AST 12 ALT 15 Cr: 0.71 PCR: 0.16 No evidence of preeclampsia D/c home  Keep OB appt Preeclampsia warning signs    06/02/2020, MD 6:29 PM 12/07/2020

## 2020-12-07 NOTE — Discharge Instructions (Signed)
Preeclampsia warning signs  

## 2020-12-08 ENCOUNTER — Inpatient Hospital Stay: Payer: BC Managed Care – PPO | Attending: Internal Medicine

## 2020-12-08 ENCOUNTER — Inpatient Hospital Stay (HOSPITAL_BASED_OUTPATIENT_CLINIC_OR_DEPARTMENT_OTHER): Payer: BC Managed Care – PPO | Admitting: Internal Medicine

## 2020-12-08 ENCOUNTER — Inpatient Hospital Stay: Payer: BC Managed Care – PPO

## 2020-12-08 DIAGNOSIS — D509 Iron deficiency anemia, unspecified: Secondary | ICD-10-CM | POA: Diagnosis not present

## 2020-12-08 DIAGNOSIS — O99113 Other diseases of the blood and blood-forming organs and certain disorders involving the immune mechanism complicating pregnancy, third trimester: Secondary | ICD-10-CM | POA: Diagnosis not present

## 2020-12-08 DIAGNOSIS — D6859 Other primary thrombophilia: Secondary | ICD-10-CM | POA: Insufficient documentation

## 2020-12-08 DIAGNOSIS — Z3A35 35 weeks gestation of pregnancy: Secondary | ICD-10-CM | POA: Insufficient documentation

## 2020-12-08 DIAGNOSIS — Z7901 Long term (current) use of anticoagulants: Secondary | ICD-10-CM | POA: Insufficient documentation

## 2020-12-08 DIAGNOSIS — O99119 Other diseases of the blood and blood-forming organs and certain disorders involving the immune mechanism complicating pregnancy, unspecified trimester: Secondary | ICD-10-CM

## 2020-12-08 LAB — CBC WITH DIFFERENTIAL/PLATELET
Abs Immature Granulocytes: 0.08 10*3/uL — ABNORMAL HIGH (ref 0.00–0.07)
Basophils Absolute: 0 10*3/uL (ref 0.0–0.1)
Basophils Relative: 0 %
Eosinophils Absolute: 0.2 10*3/uL (ref 0.0–0.5)
Eosinophils Relative: 2 %
HCT: 32.4 % — ABNORMAL LOW (ref 36.0–46.0)
Hemoglobin: 10.4 g/dL — ABNORMAL LOW (ref 12.0–15.0)
Immature Granulocytes: 1 %
Lymphocytes Relative: 19 %
Lymphs Abs: 2.4 10*3/uL (ref 0.7–4.0)
MCH: 23.5 pg — ABNORMAL LOW (ref 26.0–34.0)
MCHC: 32.1 g/dL (ref 30.0–36.0)
MCV: 73.1 fL — ABNORMAL LOW (ref 80.0–100.0)
Monocytes Absolute: 0.4 10*3/uL (ref 0.1–1.0)
Monocytes Relative: 3 %
Neutro Abs: 9.4 10*3/uL — ABNORMAL HIGH (ref 1.7–7.7)
Neutrophils Relative %: 75 %
Platelets: 219 10*3/uL (ref 150–400)
RBC: 4.43 MIL/uL (ref 3.87–5.11)
RDW: 17.2 % — ABNORMAL HIGH (ref 11.5–15.5)
WBC: 12.4 10*3/uL — ABNORMAL HIGH (ref 4.0–10.5)
nRBC: 0 % (ref 0.0–0.2)

## 2020-12-08 LAB — BASIC METABOLIC PANEL
Anion gap: 11 (ref 5–15)
BUN: 8 mg/dL (ref 6–20)
CO2: 20 mmol/L — ABNORMAL LOW (ref 22–32)
Calcium: 8.8 mg/dL — ABNORMAL LOW (ref 8.9–10.3)
Chloride: 105 mmol/L (ref 98–111)
Creatinine, Ser: 0.6 mg/dL (ref 0.44–1.00)
GFR, Estimated: >60 mL/min (ref >60)
Glucose, Bld: 127 mg/dL — ABNORMAL HIGH (ref 70–99)
Potassium: 3.7 mmol/L (ref 3.5–5.1)
Sodium: 136 mmol/L (ref 135–145)

## 2020-12-08 NOTE — Assessment & Plan Note (Addendum)
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  Repeat testing-March 2021-protein S total antigen- 51%; protein S activity 39%.  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   # Continue anticoagulation until 24 hours prior to delivery; EDD- Jan 9th C 2022.  Patient understands to stop taking her Lovenox day prior to her planned C-section.  Patient understands that she will switch over to Lovenox partum for 6 weeks.  # Anemia- Hb 10.4-ferritin- 5 - on pre-natal iron tablets.  She is concerned about infusion reactions.  Okay to hold off on infusion  #Hypocalcemia-calcium 8.8/ right LE cramping vit D ca+ vit D 500/800 BID.   # DISPOSITION: # no infusion # follow up second week of Feb 2022- labs- cbc/bmp--Dr.B

## 2020-12-08 NOTE — Progress Notes (Signed)
[facility] CONSULT NOTE  Patient Care Team: Patient, No Pcp Per as PCP - General (General Practice)  CHIEF COMPLAINTS/PURPOSE OF CONSULTATION: DVT/PE  # JAN 2021 [[facility]; JAN 2021 ]Protein S deficiency-48% [60 to 150]; free protein S 39% [57-157].  Prothrombin gene mutation/factor V Leiden; protein S; MTHFR- normal.  Antithrombin 3 antigen; anticardiolipin antibody IgM-9; beta-2 glycoprotein antibody IgG IgM- normal; lupus anticoagulant DRV VT-normal; PTT PT normal;  Repeat testing-March 2021-protein S total antigen- 51%; protein S activity 39%; mid June 2021-Lovenox 150 mg SQ  #November 2021-third trimester-iron deficient anemia; IV Venofer.  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     HISTORY OF PRESENTING ILLNESS:  [name] 39 y.o.  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  Patient is awaiting C-section on January 9.  She continues to be on Lovenox injections.  No bleeding episodes.   Review of Systems  Constitutional: Negative for chills, diaphoresis, fever, malaise/fatigue and weight loss.  HENT: Negative for nosebleeds and sore throat.   Eyes: Negative for double vision.  Respiratory: Negative for cough, hemoptysis, sputum production, shortness of breath and wheezing.   Cardiovascular: Negative for chest pain, palpitations, orthopnea and leg swelling.  Gastrointestinal: Negative for abdominal pain, blood in stool, constipation, diarrhea, heartburn, melena, nausea and vomiting.  Genitourinary: Negative for dysuria, frequency and urgency.  Musculoskeletal: Negative for back pain and joint pain.  Neurological: Negative for dizziness, tingling, focal weakness, weakness and headaches.  Endo/Heme/Allergies: Does not bruise/bleed easily.  Psychiatric/Behavioral: Negative for depression. The patient is  nervous/anxious. The patient does not have insomnia.      MEDICAL HISTORY:  Past Medical History:  Diagnosis Date  . Anemia   . History of chicken pox   . History of miscarriage   . Protein S deficiency (HCC)     SURGICAL HISTORY: Past Surgical History:  Procedure Laterality Date  . CESAREAN SECTION      SOCIAL HISTORY: Social History   Socioeconomic History  . Marital status: Single    Spouse name: Not on file  . Number of children: 1  . Years of education: Not on file  . Highest education level: Not on file  Occupational History  . Not on file  Tobacco Use  . Smoking status: Never Smoker  . Smokeless tobacco: Never Used  Vaping Use  . Vaping Use: Never used  Substance and Sexual Activity  . Alcohol use: Yes    Comment: occasional  . Drug use: No  . Sexual activity: Yes    Birth control/protection: Condom  Other Topics Concern  . 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    Social Determinants of Health   Financial Resource Strain: Not on file  Food Insecurity: Not on file  Transportation Needs: Not on file  Physical Activity: Not on file  Stress: Not on file  Social Connections: Not on file  Intimate Partner Violence: Not on file    FAMILY HISTORY: Family History  Problem Relation Age of Onset  . Cancer Father   . Cancer Sister     ALLERGIES:  has No Known Allergies.  MEDICATIONS:  Current Outpatient Medications  Medication Sig Dispense Refill  . enoxaparin (LOVENOX) 150 MG/ML injection INJECT 1 ML (150 MG TOTAL) INTO THE SKIN  DAILY. 30 mL 1  . iron polysaccharides (NIFEREX) 150 MG capsule Take 150 mg by mouth daily.    . Prenatal Vit-Fe Fumarate-FA (MULTIVITAMIN-PRENATAL) 27-0.8 MG TABS tablet Take 1 tablet by mouth daily at 12 noon.     No current facility-administered medications for this visit.   PHYSICAL EXAMINATION:  Vitals:   12/08/20 1354  BP: (!) 129/51   Pulse: (!) 107  Temp: 98.4 F (36.9 C)  SpO2: 99%   Filed Weights   12/08/20 1354  Weight: 281 lb 6.4 oz (127.6 kg)    Physical Exam HENT:     Head: Normocephalic and atraumatic.     Mouth/Throat:     Pharynx: No oropharyngeal exudate.  Eyes:     Pupils: Pupils are equal, round, and reactive to light.  Cardiovascular:     Rate and Rhythm: Normal rate and regular rhythm.  Pulmonary:     Effort: No respiratory distress.     Breath sounds: No wheezing.  Abdominal:     General: Bowel sounds are normal. There is no distension.     Palpations: Abdomen is soft. There is no mass.     Tenderness: There is no abdominal tenderness. There is no guarding or rebound.  Musculoskeletal:        General: No tenderness. Normal range of motion.     Cervical back: Normal range of motion and neck supple.  Skin:    General: Skin is warm.     Comments: Wheals noted on the abdomen at the site of Lovenox injections.  Neurological:     Mental Status: She is alert and oriented to person, place, and time.  Psychiatric:        Mood and Affect: Affect normal.      LABORATORY DATA:  I have reviewed the data as listed Lab Results  Component Value Date   WBC 12.4 (H) 12/08/2020   HGB 10.4 (L) 12/08/2020   HCT 32.4 (L) 12/08/2020   MCV 73.1 (L) 12/08/2020   PLT 219 12/08/2020   Recent Labs    03/22/20 1456 10/16/20 1527 12/07/20 1828 12/08/20 1339  NA 135 134* 135 136  K 3.8 4.0 3.8 3.7  CL 104 105 104 105  CO2 24 22 21* 20*  GLUCOSE 116* 129* 83 127*  BUN 10 8 7 8   CREATININE 0.96 0.78 0.71 0.60  CALCIUM 8.6* 8.1* 9.0 8.8*  GFRNONAA >60 >60 >60 >60  GFRAA >60  --   --   --   PROT 7.6  --  6.6  --   ALBUMIN 3.6  --  2.7*  --   AST 13*  --  12*  --   ALT 11  --  15  --   ALKPHOS 98  --  133*  --   BILITOT 0.2*  --  0.4  --     RADIOGRAPHIC STUDIES: I have personally reviewed the radiological images as listed and agreed with the findings in the report. MFM OB  LIMITED  Result Date: 11/30/2020 ----------------------------------------------------------------------  OBSTETRICS REPORT                       (Signed Final 11/30/2020 03:20 pm) ---------------------------------------------------------------------- Patient Info  ID #:       [id number]                          D.O.B.:  January 04, 1981 (39 yrs)  Name:  [name]               Visit Date: 11/30/2020 10:50 am ---------------------------------------------------------------------- Performed By  Attending:        Lin Landsman      Referred By:      [facility] MAU/Triage                    MD  Performed By:     Hurman Horn          [facility] ---------------------------------------------------------------------- Orders  #  Description                           Code        Ordered By  1  [place] MFM OB LIMITED                     [phone]    SHERONETTE                                                       COUSINS ----------------------------------------------------------------------  #  Order #                     Accession #                Episode #  1  [id number]                   [id number]                 [id number] ---------------------------------------------------------------------- Indications  [redacted] weeks gestation of pregnancy                Z3A.34  Pelvic pain affecting pregnancy in third       O26.893  trimester  Advanced maternal age multigravida (56),       O09.523  third trimester  Previous cesarean delivery, antepartum         O34.219 ---------------------------------------------------------------------- Fetal Evaluation  Num Of Fetuses:         1  Fetal Heart Rate(bpm):  141  Cardiac Activity:       Observed  Presentation:           Cephalic  Placenta:               Anterior  P. Cord Insertion:      Visualized, central  Amniotic Fluid  AFI FV:      Within normal limits  AFI Sum(cm)     %Tile       Largest  Pocket(cm)  14.7            53          4.9  RUQ(cm)       RLQ(cm)       LUQ(cm)        LLQ(cm)  4.3  3.5           4.9            2  Comment:    No placental abruption or previa identified. ---------------------------------------------------------------------- OB History  Gravidity:    2         Term:   1 ---------------------------------------------------------------------- Gestational Age  LMP:           34w 4d        Date:  04/02/20                 EDD:   01/07/21  Best:          34w 4d     Det. By:  LMP  (04/02/20)          EDD:   01/07/21 ---------------------------------------------------------------------- Impression  Limited exam to assess abdominal pain  There is good fetal movement and amniotic fluid  There is no evidence of placental previa or placenta previa ---------------------------------------------------------------------- Recommendations  Given Ms. Ketchem age of 14 consider serial growth exams  and initiate weekly testing at 36 weeks. ----------------------------------------------------------------------               Lin Landsman, MD Electronically Signed Final Report   11/30/2020 03:20 pm ----------------------------------------------------------------------   ASSESSMENT & PLAN:   Protein S deficiency affecting pregnancy (HCC) #Protein S deficiency-48% [60 to 150]; free protein S 39% [57-157].  Repeat testing-March 2021-protein S total antigen- 51%; protein S activity 39%.  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   # Continue anticoagulation until 24 hours prior to delivery;plan to switch to HEparin SQ [OB]  At 36 weeks [epidural] EDD- Jan 9th C 2022.  Patient understands that she will switch over to Lovenox partum for 6 weeks.  # Anemia- Hb 10.4-ferritin- 5 - on pre-natal vitamins.   #Hypocalcemia-calcium 8.1/ right LE cramping vit D ca+ vit D 500/800 BID.   # DISPOSITION: # no infusion # follow up second week of Feb 2022- labs-  cbc/bmp--Dr.B    All questions were answered. The patient knows to call the clinic with any problems, questions or concerns.    Earna Coder, MD 12/08/2020 2:12 PM

## 2020-12-18 ENCOUNTER — Encounter (HOSPITAL_COMMUNITY): Payer: Self-pay

## 2020-12-18 NOTE — Patient Instructions (Signed)
[name]  12/18/2020   Your procedure is scheduled on:  12/31/2020  Arrive at 0530 at [employer] [facility] on [address] at [facility]  and [employer]. You are invited to use the FREE valet parking or use the Visitor's parking deck.  Pick up the phone at the desk and dial [phone].  Call this number if you have problems the morning of surgery: [phone]  Remember:   Do not eat food:(After Midnight) Desps de medianoche.  Do not drink clear liquids: (After Midnight) Desps de medianoche.  Take these medicines the morning of surgery with A SIP OF WATER:  Take your lovenox on 1/8 at 0530 AM.  No medications the day of surgery.   Do not wear jewelry, make-up or nail polish.  Do not wear lotions, powders, or perfumes. Do not wear deodorant.  Do not shave 48 hours prior to surgery.  Do not bring valuables to the hospital.  [facility] is not   responsible for any belongings or valuables brought to the hospital.  Contacts, dentures or bridgework may not be worn into surgery.  Leave suitcase in the car. After surgery it may be brought to your room.  For patients admitted to the hospital, checkout time is 11:00 AM the day of              discharge.      Please read over the following fact sheets that you were given:     Preparing for Surgery

## 2020-12-28 ENCOUNTER — Inpatient Hospital Stay (HOSPITAL_COMMUNITY)
Admission: AD | Admit: 2020-12-28 | Discharge: 2020-12-28 | Disposition: A | Discharge status: Home / Self Care | Payer: BC Managed Care – PPO | Source: Home / Self Care | Attending: Obstetrics and Gynecology | Admitting: Obstetrics and Gynecology

## 2020-12-28 ENCOUNTER — Inpatient Hospital Stay (HOSPITAL_BASED_OUTPATIENT_CLINIC_OR_DEPARTMENT_OTHER): Payer: BC Managed Care – PPO

## 2020-12-28 ENCOUNTER — Other Ambulatory Visit: Payer: Self-pay

## 2020-12-28 ENCOUNTER — Encounter (HOSPITAL_COMMUNITY): Payer: Self-pay | Admitting: Obstetrics and Gynecology

## 2020-12-28 DIAGNOSIS — O368330 Maternal care for abnormalities of the fetal heart rate or rhythm, third trimester, not applicable or unspecified: Secondary | ICD-10-CM | POA: Insufficient documentation

## 2020-12-28 DIAGNOSIS — D6859 Other primary thrombophilia: Secondary | ICD-10-CM | POA: Insufficient documentation

## 2020-12-28 DIAGNOSIS — Z3A38 38 weeks gestation of pregnancy: Secondary | ICD-10-CM

## 2020-12-28 DIAGNOSIS — O34211 Maternal care for low transverse scar from previous cesarean delivery: Secondary | ICD-10-CM | POA: Diagnosis not present

## 2020-12-28 DIAGNOSIS — O288 Other abnormal findings on antenatal screening of mother: Secondary | ICD-10-CM | POA: Diagnosis not present

## 2020-12-28 DIAGNOSIS — Z3689 Encounter for other specified antenatal screening: Secondary | ICD-10-CM | POA: Insufficient documentation

## 2020-12-28 DIAGNOSIS — O99113 Other diseases of the blood and blood-forming organs and certain disorders involving the immune mechanism complicating pregnancy, third trimester: Secondary | ICD-10-CM | POA: Insufficient documentation

## 2020-12-28 DIAGNOSIS — O34219 Maternal care for unspecified type scar from previous cesarean delivery: Secondary | ICD-10-CM | POA: Insufficient documentation

## 2020-12-28 NOTE — MAU Note (Signed)
Pt sent from Office for non reactive NST and B/PP. Denieis any pain or cramping. Good fetal movement

## 2020-12-28 NOTE — MAU Provider Note (Signed)
History     Chief Complaint  Patient presents with   Non-stress Test   40 yo G4P1 BF @ 38 4/[redacted] weeks gestation sent for further testing due to prolonged  non reactive Test in the  Office done for AMA.   OB History     Gravida  4   Para  1   Term  1   Preterm      AB  2   Living  1      SAB  2   IAB      Ectopic      Multiple      Live Births  1           Past Medical History:  Diagnosis Date   Anemia    History of chicken pox    History of miscarriage    Protein S deficiency (HCC)     Past Surgical History:  Procedure Laterality Date   CESAREAN SECTION      Family History  Problem Relation Age of Onset   Cancer Father    Hypertension Father    Cancer Sister     Social History   Tobacco Use   Smoking status: Never Smoker   Smokeless tobacco: Never Used  Vaping Use   Vaping Use: Never used  Substance Use Topics   Alcohol use: Not Currently    Comment: occasional   Drug use: No    Allergies: No Known Allergies  Medications Prior to Admission  Medication Sig Dispense Refill Last Dose   acetaminophen (TYLENOL) 500 MG tablet Take 1,000 mg by mouth 2 (two) times daily as needed (pain.).   Past Week at Unknown time   enoxaparin (LOVENOX) 150 MG/ML injection INJECT 1 ML (150 MG TOTAL) INTO THE SKIN DAILY. (Patient taking differently: Inject 150 mg into the skin daily. In the morning) 30 mL 1 12/28/2020 at Unknown time   iron polysaccharides (NIFEREX) 150 MG capsule Take 150 mg by mouth daily.   12/28/2020 at Unknown time   Prenatal MV & Min w/FA-DHA (PRENATAL GUMMIES PO) Take 2 tablets by mouth every evening.   12/28/2020 at Unknown time     Physical Exam   Blood pressure (!) 152/78, pulse (!) 101, temperature 98.2 F (36.8 C), resp. rate 18, height 5\' 7"  (1.702 m), weight 127.5 kg, last menstrual period 04/02/2020.  No exam performed today,  sent for testing from office . ED Course   Non reactive NST AMA Protein S deficiency Previous  C/S IUP @ 38 4/7 weeks P) NST/BPP MDM  Tracing reviewed. Baseline 145 (+) accel to 160 Rare ctx 6/7 MFM FETAL BPP WO NON STRESS  Result Date: 12/29/2020 ----------------------------------------------------------------------  OBSTETRICS REPORT                       (Signed Final 12/29/2020 10:31 am) ---------------------------------------------------------------------- Patient Info  ID #:       02/26/2021                          D.O.B.:  1981-04-20 (39 yrs)  Name:       [birth year] Drum               Visit Date: 12/28/2020 05:26 pm ---------------------------------------------------------------------- Performed By  Attending:        02/25/2021      Secondary Phy.:   Lin Landsman  MD                                                             Clance Baquero MD  Performed By:     Sandi Mealy        Address:          Royanne Foots                    RDMS                                                             OB/GYN                                                             [phone] [address]                                                             [place]                                                             [address]  Referred By:      [facility] MAU/Triage         [facility] ---------------------------------------------------------------------- Orders  #  Description                           Code        Ordered By  1  [place] MFM FETAL BPP WO NON               18299.37    Ajanae Virag     STRESS  Kinnick Maus ----------------------------------------------------------------------  #  Order #                     Accession #                Episode #  1  [id number]                   [id number]                 [id number] ----------------------------------------------------------------------  Indications  [redacted] weeks gestation of pregnancy                Z3A.38  Non-reactive NST                               O28.9 ---------------------------------------------------------------------- Fetal Evaluation  Num Of Fetuses:         1  Fetal Heart Rate(bpm):  135  Cardiac Activity:       Observed  Presentation:           Cephalic  Placenta:               Anterior  P. Cord Insertion:      Visualized  Amniotic Fluid  AFI FV:      Within normal limits  AFI Sum(cm)     %Tile       Largest Pocket(cm)  17.1            68          5.3  RUQ(cm)       RLQ(cm)       LUQ(cm)        LLQ(cm)  5.3           5             3.7            3.1 ---------------------------------------------------------------------- Biophysical Evaluation  Amniotic F.V:   Pocket => 2 cm             F. Tone:        Observed  F. Movement:    Observed                   Score:          8/8  F. Breathing:   Observed ---------------------------------------------------------------------- Biometry  LV:        4.6  mm ---------------------------------------------------------------------- OB History  Gravidity:    2         Term:   1 ---------------------------------------------------------------------- Gestational Age  LMP:           38w 4d        Date:  04/02/20                 EDD:   01/07/21  Best:          38w 4d     Det. By:  LMP  (04/02/20)          EDD:   01/07/21 ---------------------------------------------------------------------- Impression  Antenatal testing performed given maternal non-reactive  NST>  The biophysical profile was 8/8 with good fetal movement and  amniotic fluid volume. ---------------------------------------------------------------------- Recommendations  Clincal correlation recommended. ----------------------------------------------------------------------               Lin Landsman, MD Electronically Signed Final Report   12/29/2020 10:31 am ----------------------------------------------------------------------   Reassuring  fetal testing Pt had nl BP in office prior  to arrival D/c home. Cont daily kick ct Keep sched rpt C/S 1/9 appt  Marvene Staff, MD 5:57 PM 12/28/2020

## 2020-12-29 ENCOUNTER — Encounter (HOSPITAL_COMMUNITY)
Admission: RE | Admit: 2020-12-29 | Discharge: 2020-12-29 | Disposition: A | Discharge status: Home / Self Care | Payer: BC Managed Care – PPO | Source: Ambulatory Visit | Attending: Obstetrics and Gynecology | Admitting: Obstetrics and Gynecology

## 2020-12-29 ENCOUNTER — Other Ambulatory Visit (HOSPITAL_COMMUNITY)
Admission: RE | Admit: 2020-12-29 | Discharge: 2020-12-29 | Disposition: A | Discharge status: Home / Self Care | Payer: BC Managed Care – PPO | Source: Ambulatory Visit | Attending: Obstetrics and Gynecology | Admitting: Obstetrics and Gynecology

## 2020-12-29 DIAGNOSIS — Z20822 Contact with and (suspected) exposure to covid-19: Secondary | ICD-10-CM | POA: Insufficient documentation

## 2020-12-29 DIAGNOSIS — Z01812 Encounter for preprocedural laboratory examination: Secondary | ICD-10-CM | POA: Insufficient documentation

## 2020-12-29 LAB — CBC
HCT: 34.1 % — ABNORMAL LOW (ref 36.0–46.0)
Hemoglobin: 10.6 g/dL — ABNORMAL LOW (ref 12.0–15.0)
MCH: 23 pg — ABNORMAL LOW (ref 26.0–34.0)
MCHC: 31.1 g/dL (ref 30.0–36.0)
MCV: 74.1 fL — ABNORMAL LOW (ref 80.0–100.0)
Platelets: 209 10*3/uL (ref 150–400)
RBC: 4.6 MIL/uL (ref 3.87–5.11)
RDW: 17.4 % — ABNORMAL HIGH (ref 11.5–15.5)
WBC: 11.2 10*3/uL — ABNORMAL HIGH (ref 4.0–10.5)
nRBC: 0 % (ref 0.0–0.2)

## 2020-12-29 LAB — SARS CORONAVIRUS 2 (TAT 6-24 HRS): SARS Coronavirus 2: NEGATIVE

## 2020-12-29 LAB — TYPE AND SCREEN
ABO/RH(D): O POS
Antibody Screen: NEGATIVE

## 2020-12-29 LAB — RPR: RPR Ser Ql: NONREACTIVE

## 2020-12-30 DIAGNOSIS — O34219 Maternal care for unspecified type scar from previous cesarean delivery: Secondary | ICD-10-CM | POA: Diagnosis present

## 2020-12-30 NOTE — Anesthesia Preprocedure Evaluation (Addendum)
Anesthesia Evaluation  Patient identified by MRN, date of birth, ID band Patient awake    Reviewed: Allergy & Precautions, NPO status , Patient's Chart, lab work & pertinent test results  Airway Mallampati: II  TM Distance: >3 FB Neck ROM: Full    Dental no notable dental hx. (+) Teeth Intact, Dental Advisory Given   Pulmonary neg pulmonary ROS,    Pulmonary exam normal breath sounds clear to auscultation       Cardiovascular Exercise Tolerance: Good negative cardio ROS Normal cardiovascular exam Rhythm:Regular Rate:Normal  ? GHTN   Neuro/Psych negative neurological ROS  negative psych ROS   GI/Hepatic negative GI ROS, Neg liver ROS,   Endo/Other  negative endocrine ROS  Renal/GU negative Renal ROS     Musculoskeletal   Abdominal (+) + obese,   Peds  Hematology Protein S deficiency Hct 10.7  Plt 209   Anesthesia Other Findings   Reproductive/Obstetrics (+) Pregnancy                            Anesthesia Physical Anesthesia Plan  ASA: III  Anesthesia Plan: Spinal   Post-op Pain Management:    Induction:   PONV Risk Score and Plan: 3 and Treatment may vary due to age or medical condition and Ondansetron  Airway Management Planned: Nasal Cannula and Simple Face Mask  Additional Equipment: None  Intra-op Plan:   Post-operative Plan:   Informed Consent: I have reviewed the patients History and Physical, chart, labs and discussed the procedure including the risks, benefits and alternatives for the proposed anesthesia with the patient or authorized representative who has indicated his/her understanding and acceptance.     Dental advisory given  Plan Discussed with: CRNA and Anesthesiologist  Anesthesia Plan Comments: (Last 0500 12/30/20  Hx of prior c/s  39 wk G4P1 for LEA)       Anesthesia Quick Evaluation

## 2020-12-30 NOTE — H&P (Signed)
[name] is a 40 y.o. female presenting @ 39 wk for repeat Cesarean section. Hx notable for Protein S deficiency  On lovenox. Last dose 5: 30 am 1/8. AMA OB History    Gravida  4   Para  1   Term  1   Preterm      AB  2   Living  1     SAB  2   IAB      Ectopic      Multiple      Live Births  1          Past Medical History:  Diagnosis Date  . Anemia   . History of chicken pox   . History of miscarriage   . Protein S deficiency [facility])    Past Surgical History:  Procedure Laterality Date  . CESAREAN SECTION     Family History: family history includes Cancer in her father and sister; Hypertension in her father. Social History:  reports that she has never smoked. She has never used smokeless tobacco. She reports previous alcohol use. She reports that she does not use drugs.     Maternal Diabetes: No Genetic Screening: Normal Maternal Ultrasounds/Referrals: Normal Fetal Ultrasounds or other Referrals:  None Maternal Substance Abuse:  No Significant Maternal Medications:  Meds include: Other: lovenox, iron Significant Maternal Lab Results:  Group B Strep negative Other Comments:  Protein S deficiency, AMA  Review of Systems  All other systems reviewed and are negative.  Maternal Medical History:  Fetal activity: Perceived fetal activity is normal.    Prenatal Complications - Diabetes: none.      Last menstrual period 04/02/2020. Exam Physical Exam Constitutional:      Appearance: She is obese.  Eyes:     Extraocular Movements: Extraocular movements intact.  Cardiovascular:     Rate and Rhythm: Regular rhythm.     Heart sounds: Normal heart sounds.  Abdominal:     Comments: Gravid Pfannensteil scar  Musculoskeletal:        General: No swelling.     Cervical back: Neck supple.  Skin:    General: Skin is warm and dry.  Neurological:     Mental Status: She is alert and oriented to person, place, and time.  Psychiatric:        Mood  and Affect: Mood normal.        Behavior: Behavior normal.     Prenatal labs: ABO, Rh: --/--/O POS (01/07 3329) Antibody: NEG (01/07 0903) Rubella:  Immune RPR: NON REACTIVE (01/07 0858)  HBsAg:   neg HIV:   neg GBS:   neg  Assessment/Plan: Previous Cesarean section Protein S deficiency Coagulation defect affecting pregnancy AMA Term gestation P) admit . Repeat Cesarean section. Resumption of lovenox pp. Risk of surgery reviewed including infection, bleeding, injury to bladder, bowel, ureter, risk of blood transfusion( HIV 1/1000000, hepatitis 12/2998, acute rxn ( 1/200) fever, internal scar tissue. All ? answered  Adison Jerger A Clement Deneault 12/30/2020, 4:59 AM

## 2020-12-30 NOTE — H&P (View-Only) (Signed)
[name] is a 39 y.o. female presenting @ 39 wk for repeat Cesarean section. Hx notable for Protein S deficiency  On lovenox. Last dose 5: 30 am 1/8. AMA OB History    Gravida  4   Para  1   Term  1   Preterm      AB  2   Living  1     SAB  2   IAB      Ectopic      Multiple      Live Births  1          Past Medical History:  Diagnosis Date  . Anemia   . History of chicken pox   . History of miscarriage   . Protein S deficiency (HCC)    Past Surgical History:  Procedure Laterality Date  . CESAREAN SECTION     Family History: family history includes Cancer in her father and sister; Hypertension in her father. Social History:  reports that she has never smoked. She has never used smokeless tobacco. She reports previous alcohol use. She reports that she does not use drugs.     Maternal Diabetes: No Genetic Screening: Normal Maternal Ultrasounds/Referrals: Normal Fetal Ultrasounds or other Referrals:  None Maternal Substance Abuse:  No Significant Maternal Medications:  Meds include: Other: lovenox, iron Significant Maternal Lab Results:  Group B Strep negative Other Comments:  Protein S deficiency, AMA  Review of Systems  All other systems reviewed and are negative.  Maternal Medical History:  Fetal activity: Perceived fetal activity is normal.    Prenatal Complications - Diabetes: none.      Last menstrual period 04/02/2020. Exam Physical Exam Constitutional:      Appearance: She is obese.  Eyes:     Extraocular Movements: Extraocular movements intact.  Cardiovascular:     Rate and Rhythm: Regular rhythm.     Heart sounds: Normal heart sounds.  Abdominal:     Comments: Gravid Pfannensteil scar  Musculoskeletal:        General: No swelling.     Cervical back: Neck supple.  Skin:    General: Skin is warm and dry.  Neurological:     Mental Status: She is alert and oriented to person, place, and time.  Psychiatric:        Mood  and Affect: Mood normal.        Behavior: Behavior normal.     Prenatal labs: ABO, Rh: --/--/O POS (01/07 0903) Antibody: NEG (01/07 0903) Rubella:  Immune RPR: NON REACTIVE (01/07 0858)  HBsAg:   neg HIV:   neg GBS:   neg  Assessment/Plan: Previous Cesarean section Protein S deficiency Coagulation defect affecting pregnancy AMA Term gestation P) admit . Repeat Cesarean section. Resumption of lovenox pp. Risk of surgery reviewed including infection, bleeding, injury to bladder, bowel, ureter, risk of blood transfusion( HIV 1/1000000, hepatitis 12/2998, acute rxn ( 1/200) fever, internal scar tissue. All ? answered  Bronsyn Shappell A Azhar Knope 12/30/2020, 4:59 AM    

## 2020-12-31 ENCOUNTER — Encounter (HOSPITAL_COMMUNITY)
Admission: RE | Disposition: A | Discharge status: Home / Self Care | Payer: Self-pay | Source: Home / Self Care | Attending: Obstetrics and Gynecology

## 2020-12-31 ENCOUNTER — Inpatient Hospital Stay (HOSPITAL_COMMUNITY)
Admission: RE | Admit: 2020-12-31 | Discharge: 2021-01-02 | DRG: 787 | Disposition: A | Discharge status: Home / Self Care | Payer: BC Managed Care – PPO | Attending: Obstetrics and Gynecology | Admitting: Obstetrics and Gynecology

## 2020-12-31 ENCOUNTER — Other Ambulatory Visit: Payer: Self-pay

## 2020-12-31 ENCOUNTER — Encounter (HOSPITAL_COMMUNITY): Payer: Self-pay | Admitting: Obstetrics and Gynecology

## 2020-12-31 ENCOUNTER — Inpatient Hospital Stay (HOSPITAL_COMMUNITY): Payer: BC Managed Care – PPO | Admitting: Anesthesiology

## 2020-12-31 ENCOUNTER — Inpatient Hospital Stay (HOSPITAL_COMMUNITY)
Admission: RE | Admit: 2020-12-31 | Payer: BC Managed Care – PPO | Source: Ambulatory Visit | Admitting: Obstetrics and Gynecology

## 2020-12-31 DIAGNOSIS — Z3A39 39 weeks gestation of pregnancy: Secondary | ICD-10-CM

## 2020-12-31 DIAGNOSIS — O34211 Maternal care for low transverse scar from previous cesarean delivery: Secondary | ICD-10-CM | POA: Diagnosis present

## 2020-12-31 DIAGNOSIS — O9081 Anemia of the puerperium: Secondary | ICD-10-CM | POA: Diagnosis not present

## 2020-12-31 DIAGNOSIS — O9912 Other diseases of the blood and blood-forming organs and certain disorders involving the immune mechanism complicating childbirth: Secondary | ICD-10-CM | POA: Diagnosis present

## 2020-12-31 DIAGNOSIS — D62 Acute posthemorrhagic anemia: Secondary | ICD-10-CM | POA: Diagnosis not present

## 2020-12-31 DIAGNOSIS — Z20822 Contact with and (suspected) exposure to covid-19: Secondary | ICD-10-CM | POA: Diagnosis present

## 2020-12-31 DIAGNOSIS — D6859 Other primary thrombophilia: Secondary | ICD-10-CM | POA: Diagnosis present

## 2020-12-31 DIAGNOSIS — O34219 Maternal care for unspecified type scar from previous cesarean delivery: Secondary | ICD-10-CM | POA: Diagnosis present

## 2020-12-31 SURGERY — Surgical Case
Anesthesia: Spinal | Site: Abdomen | Wound class: Clean Contaminated

## 2020-12-31 MED ORDER — MORPHINE SULFATE (PF) 0.5 MG/ML IJ SOLN
INTRAMUSCULAR | Status: DC | PRN
  Administered 2020-12-31: 150 ug via INTRATHECAL

## 2020-12-31 MED ORDER — ONDANSETRON HCL 4 MG/2ML IJ SOLN: 4.0000 mg | Freq: Once | INTRAMUSCULAR | Status: DC | PRN

## 2020-12-31 MED ORDER — NALBUPHINE HCL 10 MG/ML IJ SOLN: 5.0000 mg | INTRAMUSCULAR | Status: DC | PRN

## 2020-12-31 MED ORDER — SIMETHICONE 80 MG PO CHEW: 80.0000 mg | CHEWABLE_TABLET | ORAL | Status: DC | PRN

## 2020-12-31 MED ORDER — MENTHOL 3 MG MT LOZG: 1.0000 | LOZENGE | OROMUCOSAL | Status: DC | PRN

## 2020-12-31 MED ORDER — DEXTROSE 5 % IV SOLN
INTRAVENOUS | Status: AC
  Filled 2020-12-31: qty 3000

## 2020-12-31 MED ORDER — DIPHENHYDRAMINE HCL 25 MG PO CAPS
25.0000 mg | ORAL_CAPSULE | Freq: Four times a day (QID) | ORAL | Status: DC | PRN
  Administered 2021-01-02: 25 mg via ORAL
  Filled 2020-12-31: qty 1

## 2020-12-31 MED ORDER — DIPHENHYDRAMINE HCL 50 MG/ML IJ SOLN: 12.5000 mg | INTRAMUSCULAR | Status: DC | PRN

## 2020-12-31 MED ORDER — HYDROMORPHONE HCL 1 MG/ML IJ SOLN: 0.2500 mg | INTRAMUSCULAR | Status: DC | PRN

## 2020-12-31 MED ORDER — LACTATED RINGERS IV SOLN: INTRAVENOUS | Status: DC

## 2020-12-31 MED ORDER — OXYCODONE HCL 5 MG/5ML PO SOLN
5.0000 mg | Freq: Once | ORAL | Status: DC | PRN
Start: 2020-12-31 — End: 2020-12-31

## 2020-12-31 MED ORDER — ENOXAPARIN SODIUM 150 MG/ML ~~LOC~~ SOLN
150.0000 mg | SUBCUTANEOUS | Status: DC
  Administered 2020-12-31 – 2021-01-01 (×2): 150 mg via SUBCUTANEOUS
  Filled 2020-12-31 (×2): qty 1

## 2020-12-31 MED ORDER — PRENATAL MULTIVITAMIN CH
1.0000 | ORAL_TABLET | Freq: Every day | ORAL | Status: DC
  Administered 2021-01-01: 1 via ORAL
  Filled 2020-12-31 (×2): qty 1

## 2020-12-31 MED ORDER — OXYTOCIN-SODIUM CHLORIDE 30-0.9 UT/500ML-% IV SOLN
INTRAVENOUS | Status: DC | PRN
  Administered 2020-12-31: 300 mL via INTRAVENOUS

## 2020-12-31 MED ORDER — BUPIVACAINE HCL (PF) 0.25 % IJ SOLN
INTRAMUSCULAR | Status: DC | PRN
  Administered 2020-12-31: 15 mL

## 2020-12-31 MED ORDER — METHYLERGONOVINE MALEATE 0.2 MG/ML IJ SOLN: 0.2000 mg | INTRAMUSCULAR | Status: DC | PRN

## 2020-12-31 MED ORDER — DEXTROSE 5 % IV SOLN
3.0000 g | INTRAVENOUS | Status: AC
  Administered 2020-12-31: 3 g via INTRAVENOUS

## 2020-12-31 MED ORDER — ONDANSETRON HCL 4 MG/2ML IJ SOLN
4.0000 mg | Freq: Three times a day (TID) | INTRAMUSCULAR | Status: DC | PRN
Start: 2020-12-31 — End: 2021-01-02
  Administered 2020-12-31: 4 mg via INTRAVENOUS
  Filled 2020-12-31: qty 2

## 2020-12-31 MED ORDER — NALOXONE HCL 4 MG/10ML IJ SOLN
1.0000 ug/kg/h | INTRAVENOUS | Status: DC | PRN
  Filled 2020-12-31: qty 5

## 2020-12-31 MED ORDER — ONDANSETRON HCL 4 MG/2ML IJ SOLN
INTRAMUSCULAR | Status: DC | PRN
  Administered 2020-12-31: 4 mg via INTRAVENOUS

## 2020-12-31 MED ORDER — FENTANYL CITRATE (PF) 100 MCG/2ML IJ SOLN: INTRAMUSCULAR | Status: DC | PRN

## 2020-12-31 MED ORDER — KETOROLAC TROMETHAMINE 30 MG/ML IJ SOLN
30.0000 mg | Freq: Once | INTRAMUSCULAR | Status: AC | PRN
  Administered 2020-12-31: 30 mg via INTRAVENOUS

## 2020-12-31 MED ORDER — NALOXONE HCL 0.4 MG/ML IJ SOLN: 0.4000 mg | INTRAMUSCULAR | Status: DC | PRN

## 2020-12-31 MED ORDER — OXYCODONE HCL 5 MG/5ML PO SOLN: 5.0000 mg | Freq: Once | ORAL | Status: DC | PRN

## 2020-12-31 MED ORDER — COCONUT OIL OIL
1.0000 | TOPICAL_OIL | Status: DC | PRN
Start: 2020-12-31 — End: 2021-01-02

## 2020-12-31 MED ORDER — NALBUPHINE HCL 10 MG/ML IJ SOLN
5.0000 mg | INTRAMUSCULAR | Status: DC | PRN
Start: 2020-12-31 — End: 2021-01-02

## 2020-12-31 MED ORDER — SODIUM CHLORIDE 0.9 % IR SOLN
Status: DC | PRN
  Administered 2020-12-31: 1000 mL

## 2020-12-31 MED ORDER — STERILE WATER FOR IRRIGATION IR SOLN
Status: DC | PRN
  Administered 2020-12-31: 1000 mL

## 2020-12-31 MED ORDER — OXYTOCIN-SODIUM CHLORIDE 30-0.9 UT/500ML-% IV SOLN: 2.5000 [IU]/h | INTRAVENOUS | Status: AC

## 2020-12-31 MED ORDER — DEXAMETHASONE SODIUM PHOSPHATE 10 MG/ML IJ SOLN
INTRAMUSCULAR | Status: DC | PRN
  Administered 2020-12-31: 10 mg via INTRAVENOUS

## 2020-12-31 MED ORDER — SCOPOLAMINE 1 MG/3DAYS TD PT72: 1.0000 | MEDICATED_PATCH | Freq: Once | TRANSDERMAL | Status: DC

## 2020-12-31 MED ORDER — OXYCODONE HCL 5 MG PO TABS: 5.0000 mg | ORAL_TABLET | Freq: Once | ORAL | Status: DC | PRN

## 2020-12-31 MED ORDER — FLEET ENEMA 7-19 GM/118ML RE ENEM: 1.0000 | ENEMA | Freq: Every day | RECTAL | Status: DC | PRN

## 2020-12-31 MED ORDER — BUPIVACAINE IN DEXTROSE 0.75-8.25 % IT SOLN
INTRATHECAL | Status: DC | PRN
  Administered 2020-12-31: 12 mg via INTRATHECAL

## 2020-12-31 MED ORDER — BISACODYL 10 MG RE SUPP: 10.0000 mg | Freq: Every day | RECTAL | Status: DC | PRN

## 2020-12-31 MED ORDER — DIBUCAINE (PERIANAL) 1 % EX OINT: 1.0000 "application " | TOPICAL_OINTMENT | CUTANEOUS | Status: DC | PRN

## 2020-12-31 MED ORDER — FENTANYL CITRATE (PF) 100 MCG/2ML IJ SOLN
INTRAMUSCULAR | Status: DC | PRN
  Administered 2020-12-31: 15 ug via INTRATHECAL

## 2020-12-31 MED ORDER — SODIUM CHLORIDE 0.9% FLUSH: 3.0000 mL | INTRAVENOUS | Status: DC | PRN

## 2020-12-31 MED ORDER — PROMETHAZINE HCL 25 MG/ML IJ SOLN: 25.0000 mg | Freq: Four times a day (QID) | INTRAMUSCULAR | Status: DC | PRN

## 2020-12-31 MED ORDER — NALBUPHINE HCL 10 MG/ML IJ SOLN: 5.0000 mg | Freq: Once | INTRAMUSCULAR | Status: DC | PRN

## 2020-12-31 MED ORDER — SODIUM CHLORIDE 0.9 % IV SOLN: 250.0000 mL | INTRAVENOUS | Status: DC

## 2020-12-31 MED ORDER — KETOROLAC TROMETHAMINE 30 MG/ML IJ SOLN: 30.0000 mg | Freq: Once | INTRAMUSCULAR | Status: DC | PRN

## 2020-12-31 MED ORDER — ZOLPIDEM TARTRATE 5 MG PO TABS: 5.0000 mg | ORAL_TABLET | Freq: Every evening | ORAL | Status: DC | PRN

## 2020-12-31 MED ORDER — POLYSACCHARIDE IRON COMPLEX 150 MG PO CAPS
150.0000 mg | ORAL_CAPSULE | Freq: Every day | ORAL | Status: DC
Start: 2020-12-31 — End: 2021-01-02
  Administered 2021-01-01 – 2021-01-02 (×2): 150 mg via ORAL
  Filled 2020-12-31 (×2): qty 1

## 2020-12-31 MED ORDER — HYDROMORPHONE HCL 1 MG/ML IJ SOLN: 1.0000 mg | INTRAMUSCULAR | Status: DC | PRN

## 2020-12-31 MED ORDER — METHYLERGONOVINE MALEATE 0.2 MG PO TABS: 0.2000 mg | ORAL_TABLET | ORAL | Status: DC | PRN

## 2020-12-31 MED ORDER — SIMETHICONE 80 MG PO CHEW
80.0000 mg | CHEWABLE_TABLET | Freq: Three times a day (TID) | ORAL | Status: DC
  Administered 2021-01-01 – 2021-01-02 (×4): 80 mg via ORAL
  Filled 2020-12-31 (×5): qty 1

## 2020-12-31 MED ORDER — POVIDONE-IODINE 10 % EX SWAB: 2.0000 "application " | Freq: Once | CUTANEOUS | Status: DC

## 2020-12-31 MED ORDER — PHENYLEPHRINE HCL-NACL 20-0.9 MG/250ML-% IV SOLN
INTRAVENOUS | Status: DC | PRN
  Administered 2020-12-31: 60 ug/min via INTRAVENOUS

## 2020-12-31 MED ORDER — SENNOSIDES-DOCUSATE SODIUM 8.6-50 MG PO TABS
2.0000 | ORAL_TABLET | Freq: Every day | ORAL | Status: DC
  Administered 2021-01-01 – 2021-01-02 (×2): 2 via ORAL
  Filled 2020-12-31 (×3): qty 2

## 2020-12-31 MED ORDER — DIPHENHYDRAMINE HCL 25 MG PO CAPS: 25.0000 mg | ORAL_CAPSULE | ORAL | Status: DC | PRN

## 2020-12-31 MED ORDER — OXYCODONE HCL 5 MG PO TABS
5.0000 mg | ORAL_TABLET | ORAL | Status: DC | PRN
Start: 2020-12-31 — End: 2021-01-02
  Administered 2021-01-01: 5 mg via ORAL
  Filled 2020-12-31 (×2): qty 1

## 2020-12-31 MED ORDER — WITCH HAZEL-GLYCERIN EX PADS
1.0000 | MEDICATED_PAD | CUTANEOUS | Status: DC | PRN
Start: 2020-12-31 — End: 2021-01-02

## 2020-12-31 MED ORDER — SODIUM CHLORIDE 0.9% FLUSH: 3.0000 mL | Freq: Two times a day (BID) | INTRAVENOUS | Status: DC

## 2020-12-31 SURGICAL SUPPLY — 49 items
APL SKNCLS STERI-STRIP NONHPOA (GAUZE/BANDAGES/DRESSINGS) ×1
BARRIER ADHS 3X4 INTERCEED (GAUZE/BANDAGES/DRESSINGS) ×2 IMPLANT
BENZOIN TINCTURE PRP APPL 2/3 (GAUZE/BANDAGES/DRESSINGS) ×1 IMPLANT
BRR ADH 4X3 ABS CNTRL BYND (GAUZE/BANDAGES/DRESSINGS) ×1
CHLORAPREP W/TINT 26ML (MISCELLANEOUS) ×2 IMPLANT
CLAMP CORD UMBIL (MISCELLANEOUS) IMPLANT
CLOTH BEACON ORANGE TIMEOUT ST (SAFETY) ×2 IMPLANT
DRAPE C SECTION CLR SCREEN (DRAPES) ×2 IMPLANT
DRESSING PREVENA PLUS CUSTOM (GAUZE/BANDAGES/DRESSINGS) IMPLANT
DRSG OPSITE POSTOP 4X10 (GAUZE/BANDAGES/DRESSINGS) ×2 IMPLANT
DRSG PREVENA PLUS CUSTOM (GAUZE/BANDAGES/DRESSINGS) ×2
ELECT REM PT RETURN 9FT ADLT (ELECTROSURGICAL) ×2
ELECTRODE REM PT RTRN 9FT ADLT (ELECTROSURGICAL) ×1 IMPLANT
EXTRACTOR VACUUM M CUP 4 TUBE (SUCTIONS) IMPLANT
GLOVE BIOGEL PI IND STRL 7.0 (GLOVE) ×2 IMPLANT
GLOVE BIOGEL PI INDICATOR 7.0 (GLOVE) ×2
GLOVE ECLIPSE 6.5 STRL STRAW (GLOVE) ×2 IMPLANT
GOWN STRL REUS W/TWL LRG LVL3 (GOWN DISPOSABLE) ×4 IMPLANT
KIT ABG SYR 3ML LUER SLIP (SYRINGE) IMPLANT
NDL HYPO 25X5/8 SAFETYGLIDE (NEEDLE) IMPLANT
NEEDLE HYPO 22GX1.5 SAFETY (NEEDLE) ×2 IMPLANT
NEEDLE HYPO 25X5/8 SAFETYGLIDE (NEEDLE) IMPLANT
NS IRRIG 1000ML POUR BTL (IV SOLUTION) ×2 IMPLANT
PACK C SECTION WH (CUSTOM PROCEDURE TRAY) ×2 IMPLANT
PAD OB MATERNITY 4.3X12.25 (PERSONAL CARE ITEMS) ×2 IMPLANT
RETRACTOR TRAXI PANNICULUS (MISCELLANEOUS) IMPLANT
RTRCTR C-SECT PINK 25CM LRG (MISCELLANEOUS) ×1 IMPLANT
STRIP CLOSURE SKIN 1/2X4 (GAUZE/BANDAGES/DRESSINGS) ×1 IMPLANT
SUT CHROMIC GUT AB #0 18 (SUTURE) IMPLANT
SUT MNCRL 0 VIOLET CTX 36 (SUTURE) ×3 IMPLANT
SUT MON AB 2-0 SH 27 (SUTURE)
SUT MON AB 2-0 SH27 (SUTURE) IMPLANT
SUT MON AB 3-0 SH 27 (SUTURE)
SUT MON AB 3-0 SH27 (SUTURE) IMPLANT
SUT MON AB 4-0 PS1 27 (SUTURE) ×1 IMPLANT
SUT MONOCRYL 0 CTX 36 (SUTURE) ×8
SUT PLAIN 2 0 (SUTURE)
SUT PLAIN 2 0 XLH (SUTURE) IMPLANT
SUT PLAIN ABS 2-0 CT1 27XMFL (SUTURE) IMPLANT
SUT VIC AB 0 CT1 36 (SUTURE) ×4 IMPLANT
SUT VIC AB 2-0 CT1 27 (SUTURE) ×4
SUT VIC AB 2-0 CT1 TAPERPNT 27 (SUTURE) ×1 IMPLANT
SUT VIC AB 4-0 PS2 27 (SUTURE) IMPLANT
SUT VICRYL 0 TIES 12 18 (SUTURE) ×1 IMPLANT
SYR CONTROL 10ML LL (SYRINGE) ×2 IMPLANT
TOWEL OR 17X24 6PK STRL BLUE (TOWEL DISPOSABLE) ×2 IMPLANT
TRAXI PANNICULUS RETRACTOR (MISCELLANEOUS) ×1
TRAY FOLEY W/BAG SLVR 14FR LF (SET/KITS/TRAYS/PACK) ×1 IMPLANT
WATER STERILE IRR 1000ML POUR (IV SOLUTION) ×2 IMPLANT

## 2020-12-31 NOTE — Transfer of Care (Signed)
Immediate Anesthesia Transfer of Care Note  Patient: [name]  Procedure(s) Performed: CESAREAN SECTION (N/A Abdomen)  Patient Location: PACU  Anesthesia Type:Spinal  Level of Consciousness: awake  Airway & Oxygen Therapy: Patient Spontanous Breathing  Post-op Assessment: Report given to RN  Post vital signs: Reviewed and stable  Last Vitals:  Vitals Value Taken Time  BP 115/82 12/31/20 0945  Temp 36.6 C 12/31/20 0930  Pulse 67 12/31/20 0946  Resp 14 12/31/20 0946  SpO2 97 % 12/31/20 0946  Vitals shown include unvalidated device data.  Last Pain:  Vitals:   12/31/20 0930  TempSrc:   PainSc: 0-No pain         Complications: No complications documented.

## 2020-12-31 NOTE — Interval H&P Note (Signed)
History and Physical Interval Note:  12/31/2020 7:15 AM  [name]  has presented today for surgery, with the diagnosis of Protein insuffiency on lovenox but wil stop Repeat C-Section.  The various methods of treatment have been discussed with the patient and family. After consideration of risks, benefits and other options for treatment, the patient has consented to  Procedure(s): CESAREAN SECTION (N/A) as a surgical intervention.  The patient's history has been reviewed, patient examined, no change in status, stable for surgery.  I have reviewed the patient's chart and labs.  Questions were answered to the patient's satisfaction.     [name] A Bethsaida Siegenthaler

## 2020-12-31 NOTE — Brief Op Note (Signed)
12/31/2020  9:15 AM  PATIENT:  [name]  40 y.o. female  PRE-OPERATIVE DIAGNOSIS:  Previous Cesarean Section, term gestation, Protein S deficiency  POST-OPERATIVE DIAGNOSIS:  Previous Cesarean Section, term gestation, Protein S deficienct  PROCEDURE:  Repeat Cesarean section, Sharl Ma hysterotomy  SURGEON:  Surgeon(s) and Role:    * Maxie Better, MD - Primary  PHYSICIAN ASSISTANT:   ASSISTANTS: none   ANESTHESIA:   spinal FINDINGS;  Live female CAN x 2 , ROT, undeveloped LUS. Omental adhesions ant abd wall, nl tubes and ovaries ant placenta EBL:  1000 ml   BLOOD ADMINISTERED:none  DRAINS: none   LOCAL MEDICATIONS USED:  MARCAINE     SPECIMEN:  No Specimen  DISPOSITION OF SPECIMEN:  N/A  COUNTS:  YES  TOURNIQUET:  * No tourniquets in log *  DICTATION: .Other Dictation: Dictation Number [id number]  PLAN OF CARE: Admit to inpatient   PATIENT DISPOSITION:  PACU - hemodynamically stable.  Delay start of Pharmacological VTE agent (>24hrs) due to surgical blood loss or risk of bleeding:  no

## 2020-12-31 NOTE — Op Note (Signed)
[name], [name] MEDICAL RECORD [id number] ACCOUNT [id number] DATE OF BIRTH:September 28, 1981 FACILITY: [facility] LOCATION: [facility] PHYSICIAN:Winston Misner A. Teegan Guinther, MD  OPERATIVE REPORT  DATE OF PROCEDURE:  12/31/2020  PREOPERATIVE DIAGNOSES:  Previous cesarean section, term gestation, protein S deficiency.  PROCEDURES:  Repeat cesarean section, Kerr hysterotomy.  POSTOPERATIVE DIAGNOSES:  previous cesarean section, term gestation, protein S deficiency.  ANESTHESIA:  Spinal.  SURGEON:  Maxie Better, MD  ASSISTANT:  None.  DESCRIPTION OF PROCEDURE:  Under adequate spinal anesthesia, the patient was placed in supine position with a left lateral tilt.  She was sterilely prepped and draped in usual fashion.  Indwelling Foley catheter was sterilely placed.  0.25% Marcaine was  injected along the previous Pfannenstiel skin incision site.  Pfannenstiel skin incision was then made, carried down to the rectus fascia.  Rectus fascia was opened transversely using cautery.  The rectus fascia was bluntly dissected off the rectus muscle inferiorly.  Superiorly, while sharply dissecting the rectus fascia off the rectus muscle, the parietal peritoneum was noted and entered followed by continued dissection openly of the rectus fascia off the muscle. The parietal peritoneum was continued to be opened superiorly, at which point, omental adhesion to the left anterior abdominal wall was noted.  The bladder also had adhesions with pulling on top of the adhesion on the left side.  Alexis retractor was then placed.   Vesicouterine peritoneum was opened transversely and the bladder sharply dissected inferiorly.  The adhesion on the left was lysed.  Curvilinear low transverse incision was then made in the midline and extended bluntly in a cephalic and caudad manner.  Artificial  rupture of membranes was done, clear amniotic fluid noted.  Subsequent delivery of a live female from the right occiput transverse  position with nuchal cord x2, which were reduced was accomplished.  Baby was bulb suctioned on the abdomen.  Delayed cord clamp x1  minute was done.  The cord was subsequently clamped, cut.  The baby was transferred to the awaiting pediatricians who assigned Apgars of 8 and 9 at one and five minutes.  Placenta was spontaneous intact, not sent to pathology.  Uterine cavity was  cleaned of debris.  Uterine incision had no extension.  Uterine incision was closed in 2 layers, the first layer of 0 Monocryl running lock stitch, second layer was imbricating using 0 Monocryl suture.  Figure-of-eight O- monocryl sutures x3 were placed along the  incision for hemostasis.  Normal tubes and ovaries were noted bilaterally.  The abdomen was irrigated and suctioned debris.  Small bleeders cauterized.  Interceed was placed in an inverted T fashion overlying the incision. Alexis retractor was then removed. Attention was then turned to  the omentum, which had a loop onto the anterior abdominal wall, which was lysed as well as opening in the omentum, which resulted in 2 pieces the hole being separated and free tied with 0 Vicryl suture. The parietal peritoneum was closed with 2-0 Vicryl.  The rectus fascia was closed with 0 Vicryl x2.  The subcutaneous area was irrigated, small bleeders cauterized.  Interrupted 2-0 plain sutures placed and the  skin approximated using 4-0 Monocryl subcuticular closure.  Steri-Strips and benzoin were placed as was the Prevena wound VAC system.  SPECIMEN:  Placenta, not sent to pathology.  ESTIMATED BLOOD LOSS:  1  liter.  INTRAOPERATIVE FLUIDS:  2300 mL.  URINE OUTPUT:  50 mL, clear yellow urine.  COUNTS:  Sponge and instrument counts x2 was correct.  COMPLICATIONS:  None.  DISPOSITION:  The patient tolerated the procedure well and was transferred to recovery room in stable condition.  IN/NUANCE  D:12/31/2020 T:12/31/2020 JOB:[id number]

## 2020-12-31 NOTE — Anesthesia Procedure Notes (Signed)
Spinal  Patient location during procedure: OB Start time: 12/31/2020 7:26 AM End time: 12/31/2020 7:34 AM Staffing Performed: anesthesiologist  Anesthesiologist: Trevor Iha, MD Preanesthetic Checklist Completed: patient identified, IV checked, risks and benefits discussed, surgical consent, monitors and equipment checked, pre-op evaluation and timeout performed Spinal Block Patient position: sitting Prep: DuraPrep and site prepped and draped Patient monitoring: heart rate, cardiac monitor, continuous pulse ox and blood pressure Approach: midline Location: L4-5 Injection technique: single-shot Needle Needle type: Whitacre  Needle gauge: 27 G Needle length: 10 cm Needle insertion depth: 10 cm Assessment Sensory level: T4 Additional Notes  3 Attempt (s). Pt tolerated procedure well.

## 2020-12-31 NOTE — Anesthesia Postprocedure Evaluation (Signed)
Anesthesia Post Note  Patient: [name]  Procedure(s) Performed: CESAREAN SECTION (N/A Abdomen)     Patient location during evaluation: Mother Baby Anesthesia Type: Spinal Level of consciousness: oriented and awake and alert Pain management: pain level controlled Vital Signs Assessment: post-procedure vital signs reviewed and stable Respiratory status: spontaneous breathing and respiratory function stable Cardiovascular status: blood pressure returned to baseline and stable Postop Assessment: no headache, no backache, no apparent nausea or vomiting and able to ambulate Anesthetic complications: no   No complications documented.  Last Vitals:  Vitals:   12/31/20 1030 12/31/20 1050  BP: 131/80 116/72  Pulse: 78 79  Resp: 13 18  Temp:  36.5 C  SpO2: 96% 98%    Last Pain:  Vitals:   12/31/20 1100  TempSrc:   PainSc: 0-No pain   Pain Goal:                Epidural/Spinal Function Cutaneous sensation: Normal sensation (12/31/20 1100), Patient able to flex knees: Yes (12/31/20 1100), Patient able to lift hips off bed: Yes (12/31/20 1100), Back pain beyond tenderness at insertion site: No (12/31/20 1100), Progressively worsening motor and/or sensory loss: No (12/31/20 1100)  Trevor Iha

## 2021-01-01 LAB — CBC
HCT: 27.4 % — ABNORMAL LOW (ref 36.0–46.0)
Hemoglobin: 8.4 g/dL — ABNORMAL LOW (ref 12.0–15.0)
MCH: 22.9 pg — ABNORMAL LOW (ref 26.0–34.0)
MCHC: 30.7 g/dL (ref 30.0–36.0)
MCV: 74.7 fL — ABNORMAL LOW (ref 80.0–100.0)
Platelets: 226 10*3/uL (ref 150–400)
RBC: 3.67 MIL/uL — ABNORMAL LOW (ref 3.87–5.11)
RDW: 17.3 % — ABNORMAL HIGH (ref 11.5–15.5)
WBC: 16.2 10*3/uL — ABNORMAL HIGH (ref 4.0–10.5)
nRBC: 0 % (ref 0.0–0.2)

## 2021-01-01 MED ORDER — ACETAMINOPHEN 500 MG PO TABS
1000.0000 mg | ORAL_TABLET | Freq: Four times a day (QID) | ORAL | Status: DC | PRN
  Administered 2021-01-01 – 2021-01-02 (×4): 1000 mg via ORAL
  Filled 2021-01-01 (×4): qty 2

## 2021-01-01 NOTE — Progress Notes (Signed)
SVD: repeat  S:  Pt reports feeling well/ Tolerating po/ Voiding without problems/ No n/v/ Bleeding is moderate/ Pain controlled withacetaminophen    O:  A & O x 3 / VS: Blood pressure 136/60, pulse 91, temperature 98.3 F (36.8 C), temperature source Oral, resp. rate 16, height 5\' 7"  (1.702 m), weight 127.5 kg, last menstrual period 04/02/2020, SpO2 100 %, unknown if currently breastfeeding.  LABS:  Results for orders placed or performed during the hospital encounter of 12/31/20 (from the past 24 hour(s))  CBC     Status: Abnormal   Collection Time: 01/01/21  8:08 AM  Result Value Ref Range   WBC 16.2 (H) 4.0 - 10.5 K/uL   RBC 3.67 (L) 3.87 - 5.11 MIL/uL   Hemoglobin 8.4 (L) 12.0 - 15.0 g/dL   HCT 03/01/21 (L) 25.8 - 52.7 %   MCV 74.7 (L) 80.0 - 100.0 fL   MCH 22.9 (L) 26.0 - 34.0 pg   MCHC 30.7 30.0 - 36.0 g/dL   RDW 78.2 (H) 42.3 - 53.6 %   Platelets 226 150 - 400 K/uL   nRBC 0.0 0.0 - 0.2 %    I&O: I/O last 3 completed shifts: In: 2090 [P.O.:90; I.V.:2000] Out: 2666 [Urine:1350; Emesis/NG output:300; Blood:1016]   Total I/O In: -  Out: 500 [Urine:500]  Lungs: chest clear, no wheezing, rales, normal symmetric air entry  Heart: regular rate and rhythm, S1, S2 normal, no murmur, click, rub or gallop  Abdomen: soft uterus at umb firm surg tender  Perineum: is normal  Lochia: mod  Extremities:no redness or tenderness in the calves or thighs, no edema    A/P: POD # 1/PPD # 2667 s/p repeat C/S IDA exacerbated by acute blood loss  Doing well  Continue routine post partum orders  IV iron infusion tomorrow

## 2021-01-02 MED ORDER — OXYCODONE HCL 5 MG PO TABS: 5.0000 mg | ORAL_TABLET | ORAL | 0 refills | Status: DC | PRN

## 2021-01-02 NOTE — Discharge Instructions (Signed)
Call if temperature greater than equal to 100.4, nothing per vagina for 4-6 weeks or severe nausea vomiting, increased incisional pain , drainage or redness in the incision site, no straining with bowel movements, showers no bath °

## 2021-01-02 NOTE — Progress Notes (Signed)
SVD: repeat  S:  Pt reports feeling well. Has c/o rash on left side of abdomen. Pt has had this in the past when she started lovenox. / Tolerating po/ Voiding without problems/ No n/v/ Bleeding is moderate/ Pain controlled withacetaminophen. Feels fine for early discharge    O:  A & O x 3 / VS: Blood pressure 136/70, pulse 98, temperature 98.3 F (36.8 C), temperature source Oral, resp. rate 17, height 5\' 7"  (1.702 m), weight 127.5 kg, last menstrual period 04/02/2020, SpO2 99 %, unknown if currently breastfeeding.  LABS: No results found for this or any previous visit (from the past 24 hour(s)).  I&O: I/O last 3 completed shifts: In: -  Out: 900 [Urine:900]   No intake/output data recorded.  Lungs: chest clear, no wheezing, rales, normal symmetric air entry  Heart: regular rate and rhythm, S1, S2 normal, no murmur, click, rub or gallop  Abdomen: soft obese. Rash not well defined. 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 Has med at home  Doing well  Continue routine post partum orders  D/c instructions reviewed F/u 1 wk for wound vac removal Cont lovenox per heme/onc

## 2021-01-02 NOTE — Progress Notes (Signed)
Dr. Cherly Hensen notified of raised rash on left thigh.No other rash noticed. No shortness of breath. Benadryl 25mg  given po.

## 2021-01-03 NOTE — Discharge Summary (Signed)
Postpartum Discharge Summary  Date of Service updated     Patient Name: [name] DOB: 1981-07-12 MRN: [id number]  Date of admission: 12/31/2020 Delivery date:12/31/2020  Delivering provider: Isa Kohlenberg  Date of discharge: 01/02/2021  Admitting diagnosis: Previous cesarean section complicating pregnancy [E52.778] Postpartum care following cesarean delivery [Z39.2] Intrauterine pregnancy: [redacted]w[redacted]d    Secondary diagnosis:  Active Problems:   Previous cesarean section complicating pregnancy   Postpartum care following cesarean delivery  Additional problems: Protein S deficiency , IDA affecting pregnancy   Discharge diagnosis: Term Pregnancy Delivered and IDA, protein S deficiency                                               Post partum procedures: none Augmentation: N/A Complications: None  Hospital course: Sceduled C/S   40y.o. yo [id number] admitted to the hospital 12/31/2020 for scheduled cesarean section with the following indication:Elective Repeat.Delivery details are as follows:  Membrane Rupture Time/Date: 8:03 AM ,12/31/2020   Delivery Method:C-Section, Low Transverse  Details of operation can be found in separate operative note.  Patient had an uncomplicated postpartum course.  She is ambulating, tolerating a regular diet, passing flatus, and urinating well. Patient is discharged home in stable condition on  01/02/2021        Newborn Data: Birth date:12/31/2020  Birth time:8:04 AM  Gender:Female  Living status:Living  Apgars:8 ,9  Weight:3.124 kg     Magnesium Sulfate received: No BMZ received: No Rhophylac:N/A MMR:N/A T-DaP:Given prenatally Flu: No Transfusion:No  Physical exam  Vitals:   01/01/21 1609 01/01/21 1823 01/01/21 2213 01/02/21 0500  BP: 140/60 131/67 140/72 136/70  Pulse: 90 (!) 104 (!) 109 98  Resp: '16  16 17  ' Temp: 98.4 F (36.9 C)  98.7 F (37.1 C) 98.3 F (36.8 C)  TempSrc: Oral  Oral Oral  SpO2:  99% 98% 99%   Weight:      Height:       General: alert, cooperative, and no distress Lochia: appropriate Uterine Fundus: firm Incision: Dressing is clean, dry, and intact DVT Evaluation: No evidence of DVT seen on physical exam. No cords or calf tenderness. No significant calf/ankle edema. Labs: Lab Results  Component Value Date   WBC 16.2 (H) 01/01/2021   HGB 8.4 (L) 01/01/2021   HCT 27.4 (L) 01/01/2021   MCV 74.7 (L) 01/01/2021   PLT 226 01/01/2021   CMP Latest Ref Rng & Units 12/08/2020  Glucose 70 - 99 mg/dL 127(H)  BUN 6 - 20 mg/dL 8  Creatinine 0.44 - 1.00 mg/dL 0.60  Sodium 135 - 145 mmol/L 136  Potassium 3.5 - 5.1 mmol/L 3.7  Chloride 98 - 111 mmol/L 105  CO2 22 - 32 mmol/L 20(L)  Calcium 8.9 - 10.3 mg/dL 8.8(L)  Total Protein 6.5 - 8.1 g/dL -  Total Bilirubin 0.3 - 1.2 mg/dL -  Alkaline Phos 38 - 126 U/L -  AST 15 - 41 U/L -  ALT 0 - 44 U/L -   Edinburgh Score: Edinburgh Postnatal Depression Scale Screening Tool 01/01/2021  I have been able to laugh and see the funny side of things. 0  I have looked forward with enjoyment to things. 0  I have blamed myself unnecessarily when things went wrong. 1  I have been anxious or worried for no good reason. 1  I have felt scared or panicky for no good reason. 0  Things have been getting on top of me. 0  I have been so unhappy that I have had difficulty sleeping. 0  I have felt sad or miserable. 0  I have been so unhappy that I have been crying. 0  The thought of harming myself has occurred to me. 0  Edinburgh Postnatal Depression Scale Total 2      After visit meds:  Allergies as of 01/02/2021   No Known Allergies      Medication List     TAKE these medications    acetaminophen 500 MG tablet Commonly known as: TYLENOL Take 1,000 mg by mouth 2 (two) times daily as needed (pain.).   enoxaparin 150 MG/ML injection Commonly known as: LOVENOX INJECT 1 ML (150 MG TOTAL) INTO THE SKIN DAILY. What changed: additional  instructions   iron polysaccharides 150 MG capsule Commonly known as: NIFEREX Take 150 mg by mouth daily.   oxyCODONE 5 MG immediate release tablet Commonly known as: Oxy IR/ROXICODONE Take 1-2 tablets (5-10 mg total) by mouth every 4 (four) hours as needed for moderate pain.   PRENATAL GUMMIES PO Take 2 tablets by mouth every evening.               Discharge Care Instructions  (From admission, onward)           Start     Ordered   01/02/21 0000  Discharge wound care:       Comments: Office will call regarding removal   01/02/21 1323             Discharge home in stable condition Infant Feeding: Bottle and Breast Infant Disposition:home with mother Discharge instruction: per After Visit Summary and Postpartum booklet. Activity: Advance as tolerated. Pelvic rest for 6 weeks.  Diet: routine diet Anticipated Birth Control: OCPs Postpartum Appointment:1 week Additional Postpartum F/U: Incision check 1 week Future Appointments: Future Appointments  Date Time Provider [place]  02/02/2021  1:00 PM [facility] None  02/02/2021  1:30 PM Cammie Sickle, MD [facility] None   Follow up Visit:  Follow-up Information     Servando Salina, MD Follow up.   Specialty: Obstetrics and Gynecology Why: office will call regarding wound vac removal Contact information: [address] [place] [address] [phone]                     01/03/2021 Marvene Staff, MD

## 2021-01-05 ENCOUNTER — Other Ambulatory Visit: Payer: Self-pay | Admitting: Internal Medicine

## 2021-01-06 ENCOUNTER — Inpatient Hospital Stay (HOSPITAL_COMMUNITY): Payer: BC Managed Care – PPO

## 2021-01-06 ENCOUNTER — Other Ambulatory Visit: Payer: Self-pay

## 2021-01-06 ENCOUNTER — Inpatient Hospital Stay (HOSPITAL_COMMUNITY)
Admission: AD | Admit: 2021-01-06 | Discharge: 2021-01-06 | Disposition: A | Discharge status: Home / Self Care | Payer: BC Managed Care – PPO | Attending: Obstetrics and Gynecology | Admitting: Obstetrics and Gynecology

## 2021-01-06 ENCOUNTER — Encounter (HOSPITAL_COMMUNITY): Payer: Self-pay | Admitting: Obstetrics and Gynecology

## 2021-01-06 DIAGNOSIS — O9913 Other diseases of the blood and blood-forming organs and certain disorders involving the immune mechanism complicating the puerperium: Secondary | ICD-10-CM | POA: Insufficient documentation

## 2021-01-06 DIAGNOSIS — D6859 Other primary thrombophilia: Secondary | ICD-10-CM | POA: Diagnosis not present

## 2021-01-06 DIAGNOSIS — Z7901 Long term (current) use of anticoagulants: Secondary | ICD-10-CM | POA: Diagnosis not present

## 2021-01-06 DIAGNOSIS — R0602 Shortness of breath: Secondary | ICD-10-CM

## 2021-01-06 DIAGNOSIS — Z20822 Contact with and (suspected) exposure to covid-19: Secondary | ICD-10-CM | POA: Insufficient documentation

## 2021-01-06 LAB — RESP PANEL BY RT-PCR (FLU A&B, COVID) ARPGX2
Influenza A by PCR: NEGATIVE
Influenza B by PCR: NEGATIVE
SARS Coronavirus 2 by RT PCR: NEGATIVE

## 2021-01-06 MED ORDER — IOHEXOL 350 MG/ML SOLN
75.0000 mL | Freq: Once | INTRAVENOUS | Status: AC | PRN
  Administered 2021-01-06: 75 mL via INTRAVENOUS

## 2021-01-06 NOTE — MAU Provider Note (Signed)
History     Chief Complaint  Patient presents with  . Shortness of Breath   40 yo G4P2 BF S/P Repeat C/S 1/9 presents this am with complaint of SOB, cough when she lays down and no vaginal bleeding. Pt called the office yesterday before noon with her complaint and was advised to  go the hospital for  Evaluation.  Pt had no explanation for not coming to hospital yesterday. Hx notable for Protein S deficiency for which the patient is on Lovenox and followed heme/onc.Took last dose of Lovenox last night.. Denies CP or fever.   OB History    Gravida  4   Para  2   Term  2   Preterm      AB  2   Living  2     SAB  2   IAB      Ectopic      Multiple  0   Live Births  2           Past Medical History:  Diagnosis Date  . Anemia   . History of chicken pox   . History of miscarriage   . Protein S deficiency [facility])     Past Surgical History:  Procedure Laterality Date  . CESAREAN SECTION    . CESAREAN SECTION N/A 12/31/2020   Procedure: CESAREAN SECTION;  Surgeon: Maxie Better, MD;  Location: [facility] LD ORS;  Service: Obstetrics;  Laterality: N/A;    Family History  Problem Relation Age of Onset  . Cancer Father   . Hypertension Father   . Cancer Sister     Social History   Tobacco Use  . Smoking status: Never Smoker  . Smokeless tobacco: Never Used  Vaping Use  . Vaping Use: Never used  Substance Use Topics  . Alcohol use: Not Currently    Comment: occasional  . Drug use: No    Allergies: No Known Allergies  Medications Prior to Admission  Medication Sig Dispense Refill Last Dose  . enoxaparin (LOVENOX) 150 MG/ML injection Inject 1 mL (150 mg total) into the skin daily. In the morning 30 mL 1 01/06/2021 at 0000  . acetaminophen (TYLENOL) 500 MG tablet Take 1,000 mg by mouth 2 (two) times daily as needed (pain.).     Marland Kitchen iron polysaccharides (NIFEREX) 150 MG capsule Take 150 mg by mouth daily.     Marland Kitchen oxyCODONE (OXY IR/ROXICODONE) 5 MG immediate release  tablet Take 1-2 tablets (5-10 mg total) by mouth every 4 (four) hours as needed for moderate pain. 30 tablet 0   . Prenatal MV & Min w/FA-DHA (PRENATAL GUMMIES PO) Take 2 tablets by mouth every evening.        Physical Exam   Blood pressure 131/67, pulse 99, temperature 98.8 F (37.1 C), temperature source Oral, resp. rate 17, SpO2 98 %, unknown if currently breastfeeding.  General appearance: alert, cooperative and no distress Lungs: clear to auscultation bilaterally Heart: regular rate and rhythm, S1, S2 normal, no murmur, click, rub or gallop Abdomen: soft obese non tender. primary dressing wound vacuum intact Extremities: edema +1 and Homans sign is negative, no sign of DVT ED Course  IMP: Dyspnea Postoperative C/S 1/9 Protein S deficiency anticoagulated P) rapid Covid 19 testing. If test is neg ,CXR( PA and lateral  eval for pulmonary edema).  MDM   Serita Kyle, MD 9:41 AM 01/06/2021

## 2021-01-06 NOTE — MAU Note (Signed)
Pt reports to mau with c/o SOB since leaving the hospital pp.  Pt states SOB has progressively gotten worse over the last few days. Pt also reporting a cough.   Pt reports no pain at this time.  States her bleeding has almost stopped completely.

## 2021-01-06 NOTE — Progress Notes (Addendum)
[id number] test neg Will proceed with CXR PA and lateral r/o pulmonary edema Patient Vitals for the past 24 hrs:  BP Temp Temp src Pulse Resp SpO2  01/06/21 0911 131/67 98.8 F (37.1 C) Oral 99 17 98 %   addendum: pt advised of all testing done ( CXR neg)and  negative w/u so far. Advised will be speaking with pulmonologist DG Chest 2 View  Result Date: 01/06/2021 CLINICAL DATA:  Shortness of breath postpartum. EXAM: CHEST - 2 VIEW COMPARISON:  Chest radiograph 04/29/2008 FINDINGS: The cardiomediastinal contours are within normal limits. The lungs are clear. No pneumothorax or pleural effusion. No acute finding in the visualized skeleton. IMPRESSION: No acute cardiopulmonary process. Electronically Signed   By: Emmaline Kluver M.D.   On: 01/06/2021 12:21   States " something is wrong" Spoke with on call Pulmonary provider: Clarisse Gouge( [phone]) who spoke with her attending who recommended  CTA PE study;. Study ordered  addendum CT scan result:   No evidence of PE  Advised pt; no cause for her complaint D/c home Patient Vitals for the past 24 hrs:  BP Temp Temp src Pulse Resp SpO2  01/06/21 1230 134/62 98.3 F (36.8 C) Oral 84 17 98 %  01/06/21 0911 131/67 98.8 F (37.1 C) Oral 99 17 98 %

## 2021-01-06 NOTE — Discharge Instructions (Signed)
Resume postpartum instructions

## 2021-01-07 IMAGING — US US MFM OB LIMITED
1 series · 15 of 28 positions shown · non-contrast
Comparison: none

[Series 1: us mfm ob limited · 58 acquisitions, 15 frames shown]
[im 1/58]
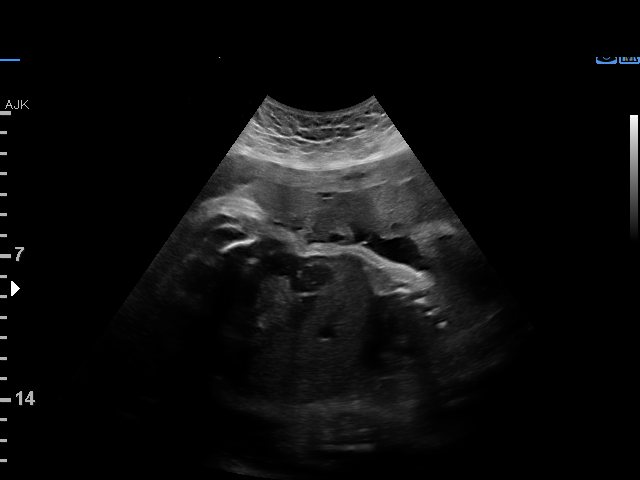
[im 5/58]
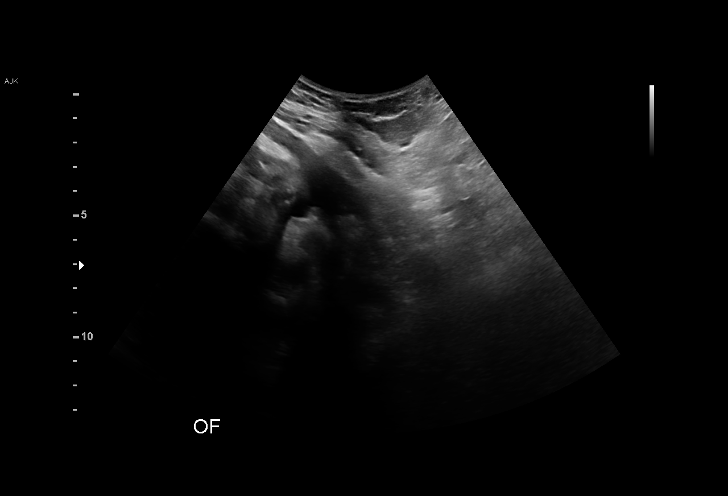
[im 9/58]
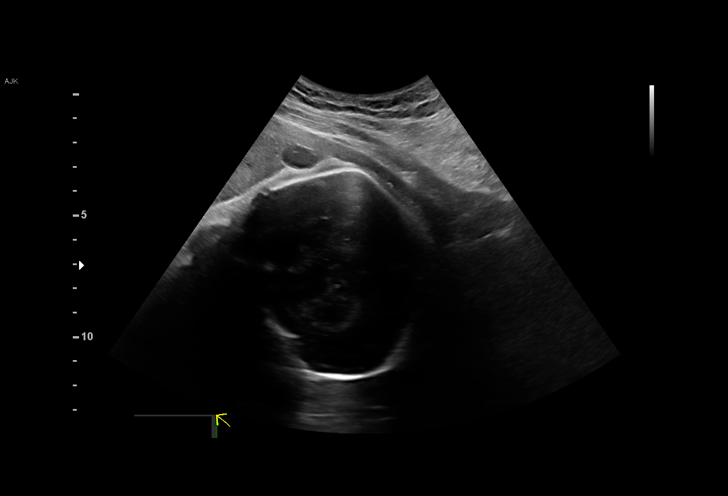
[im 13/58]
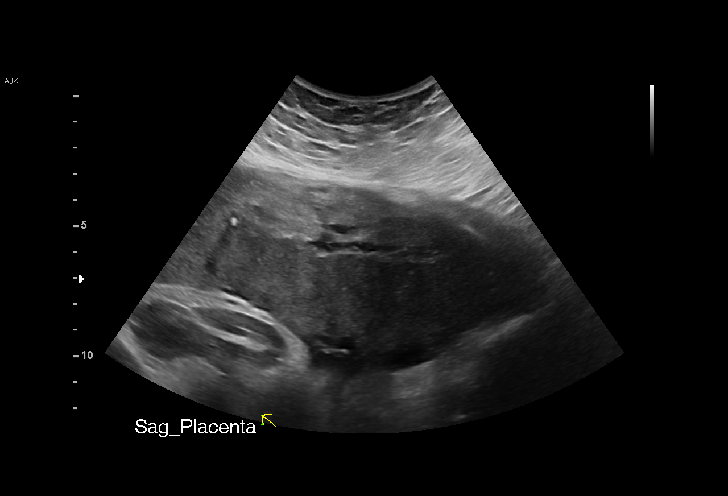
[im 17/58]
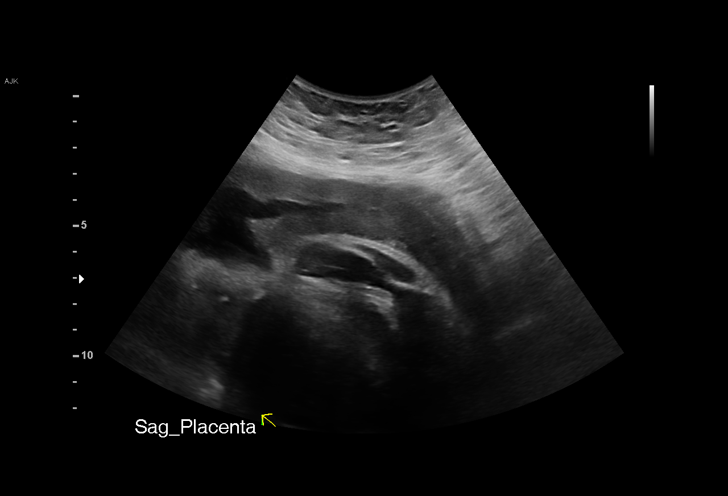
[im 22/58]
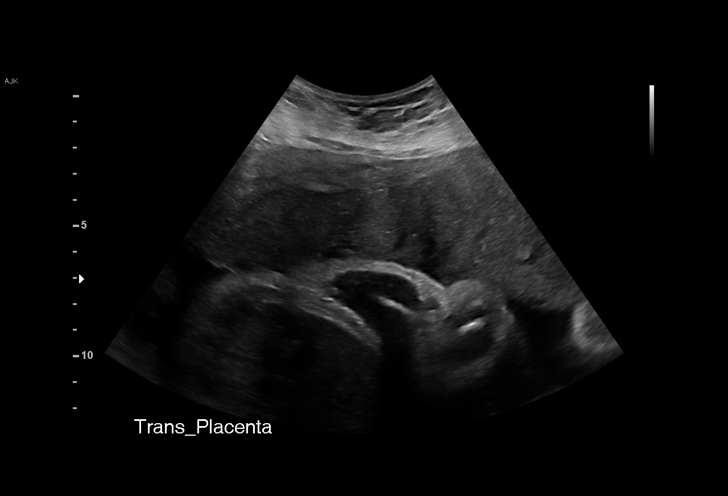
[im 26/58]
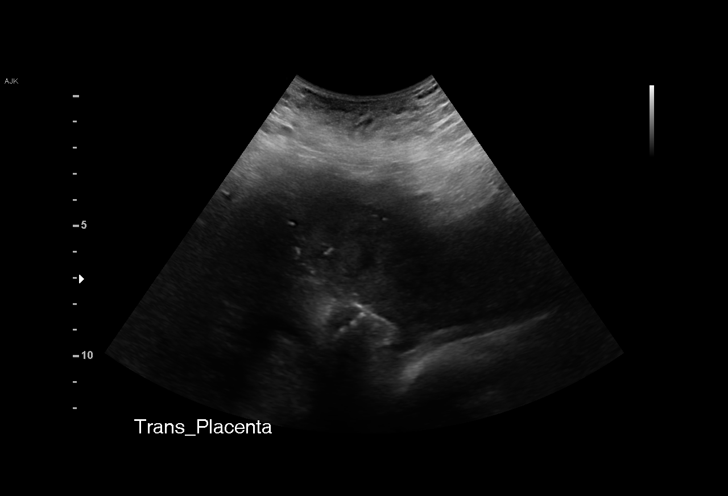
[im 30/58]
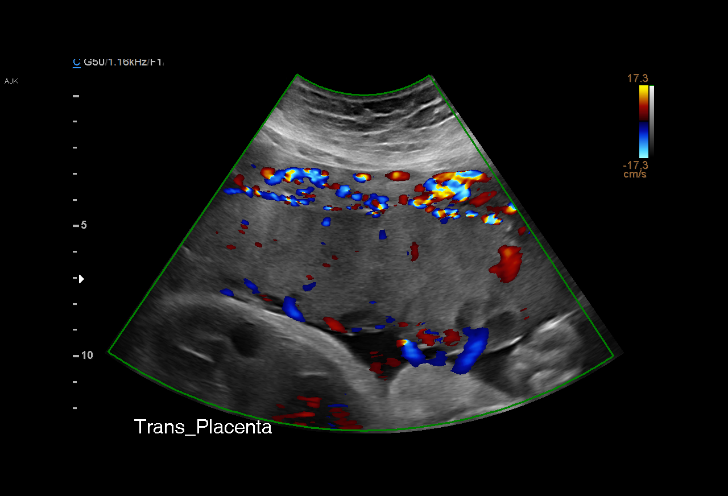
[im 32/58]
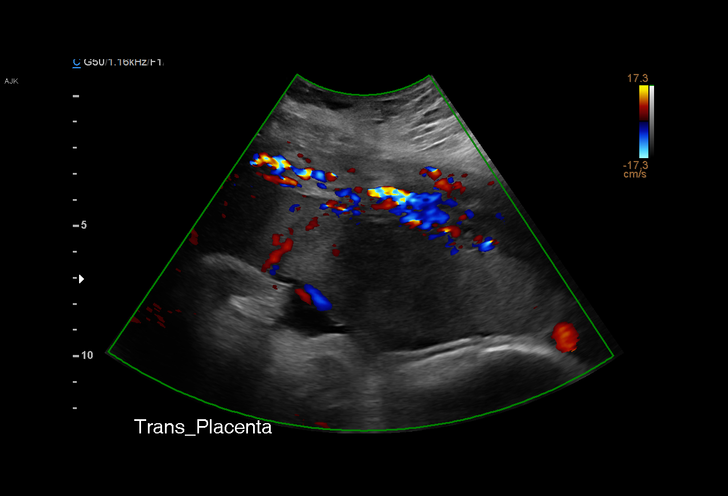
[im 36/58]
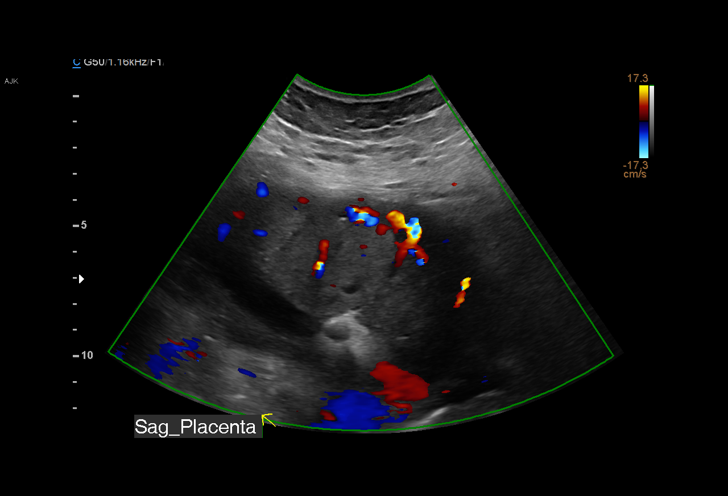
[im 41/58]
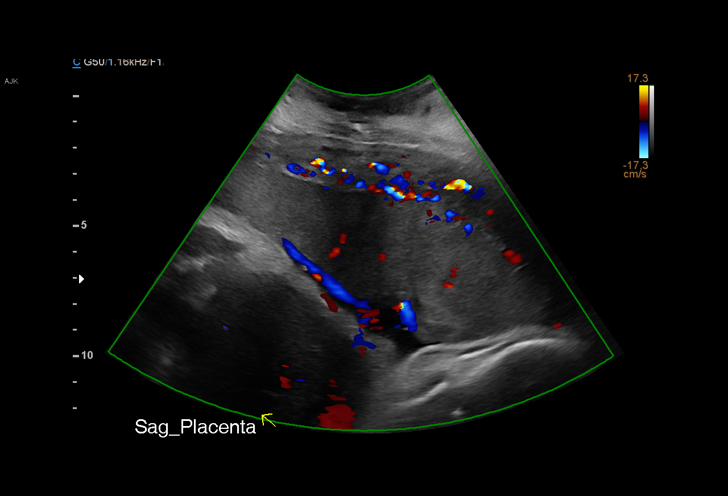
[im 45/58]
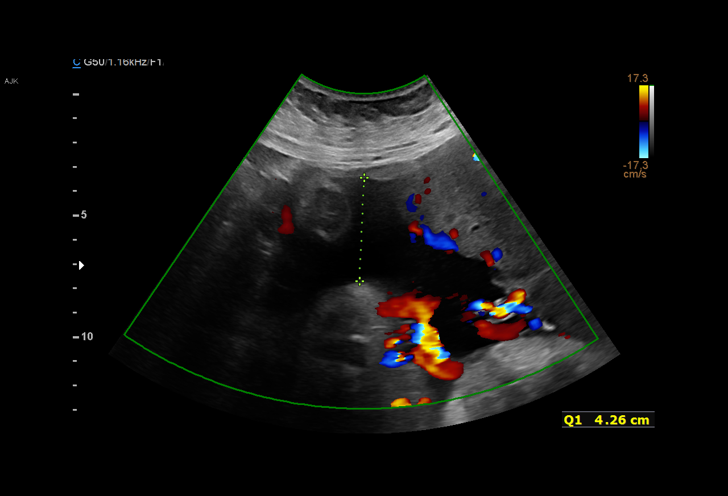
[im 49/58]
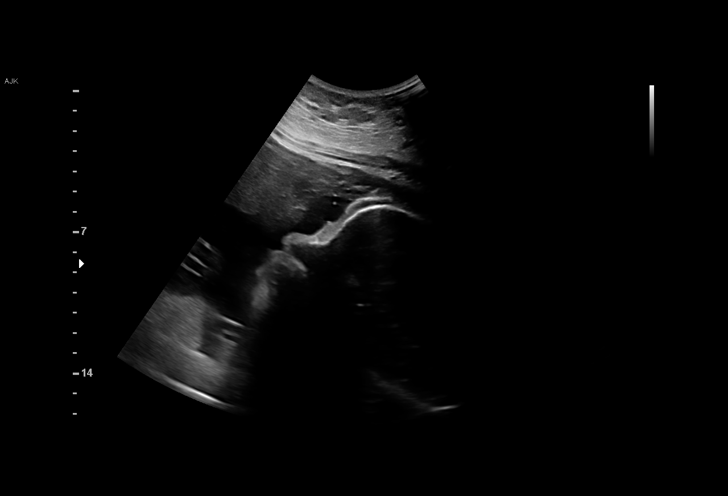
[im 53/58]
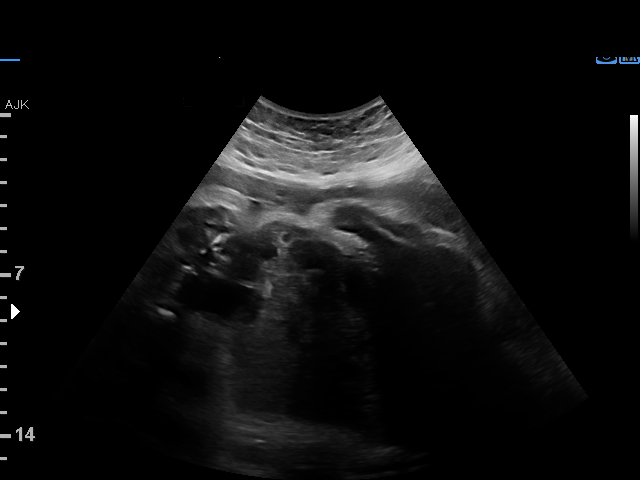
[im 58/58]
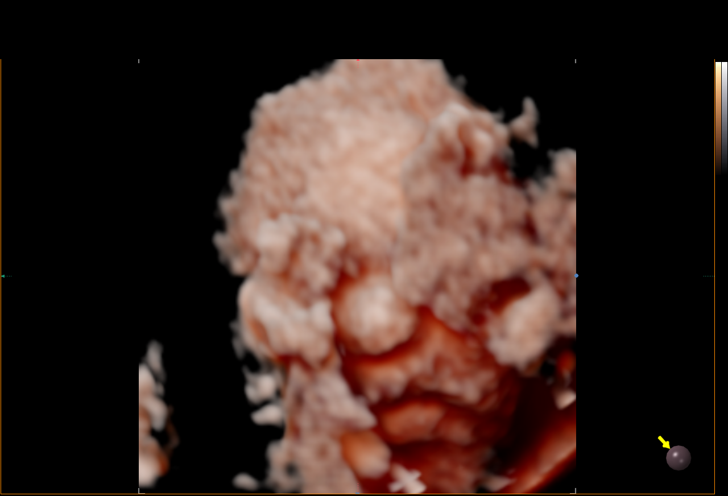

[15 of 28 positions shown; findings below may reference images not displayed]

1  US MFM OB LIMITED                     76815.01    TIETZ
                                                      BEDELL

Indications

 34 weeks gestation of pregnancy
 Pelvic pain affecting pregnancy in third
 trimester
 Advanced maternal age multigravida (39),
 third trimester
 Previous cesarean delivery, antepartum
Fetal Evaluation

 Num Of Fetuses:         1
 Fetal Heart Rate(bpm):  141
 Cardiac Activity:       Observed
 Presentation:           Cephalic
 Placenta:               Anterior
 P. Cord Insertion:      Visualized, central

 Amniotic Fluid
 AFI FV:      Within normal limits

 AFI Sum(cm)     %Tile       Largest Pocket(cm)
 14.7            53

 RUQ(cm)       RLQ(cm)       LUQ(cm)        LLQ(cm)
 4.3           3.5           4.9            2

 Comment:    No placental abruption or previa identified.
OB History

 Gravidity:    2         Term:   1
Gestational Age

 LMP:           34w 4d        Date:  04/02/20                 EDD:   01/07/21
 Best:          34w 4d     Det. By:  LMP  (04/02/20)          EDD:   01/07/21
Impression

 Limited exam to assess abdominal pain
 There is good fetal movement and amniotic fluid
 There is no evidence of placental previa or placenta previa
Recommendations

 Given Ms. Shankar age of 39 consider serial growth exams
 and initiate weekly testing at 36 weeks.

## 2021-02-02 ENCOUNTER — Other Ambulatory Visit: Payer: Self-pay

## 2021-02-02 ENCOUNTER — Inpatient Hospital Stay: Payer: BC Managed Care – PPO | Attending: Internal Medicine

## 2021-02-02 ENCOUNTER — Telehealth: Payer: Self-pay | Admitting: Internal Medicine

## 2021-02-02 ENCOUNTER — Inpatient Hospital Stay (HOSPITAL_BASED_OUTPATIENT_CLINIC_OR_DEPARTMENT_OTHER): Payer: BC Managed Care – PPO | Admitting: Internal Medicine

## 2021-02-02 DIAGNOSIS — D6859 Other primary thrombophilia: Secondary | ICD-10-CM

## 2021-02-02 DIAGNOSIS — O99119 Other diseases of the blood and blood-forming organs and certain disorders involving the immune mechanism complicating pregnancy, unspecified trimester: Secondary | ICD-10-CM

## 2021-02-02 MED ORDER — ENOXAPARIN SODIUM 100 MG/ML ~~LOC~~ SOLN: 100.0000 mg | SUBCUTANEOUS | 1 refills | Status: DC

## 2021-02-02 NOTE — Assessment & Plan Note (Addendum)
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  Repeat testing-March 2021-protein S total antigen- 51%; protein S activity 39%.  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   #Patient delivered baby on Jan 09 without any complications.  Recommend total of 12 weeks of anticoagulation postpartum.  Given the easy bruising-recommend decreasing the dose to 100 mg subcu daily. [current weight 112 kg].  New refill sent to the pharmacy for 2 months and then stop.  # Anemia- Hb 10.4-ferritin- 5 - on pre-natal iron tablets.  We'll repeat labs at next visit.  #Hypocalcemia-continue cramping vit D ca+ vit D 500/800 BID.   # DISPOSITION: # Follow up in 3 months- MD; labs- cbc/cmp;iron studies;ferritin---Dr.B

## 2021-02-02 NOTE — Progress Notes (Signed)
I connected with [name] on 02/02/21 at  3:00 PM EST by video enabled telemedicine visit and verified that I am speaking with the correct person using two identifiers.  I discussed the limitations, risks, security and privacy concerns of performing an evaluation and management service by telemedicine and the availability of in-person appointments. I also discussed with the patient that there may be a patient responsible charge related to this service. The patient expressed understanding and agreed to proceed.    Other persons participating in the visit and their role in the encounter: RN/medical reconciliation Patient's location: home Provider's location: office  Oncology History   No history exists.     Chief Complaint: Hypercoagulable state/on Lovenox - 4 weeks postpartum.   History of present illness:[name] 40 y.o.  female with history of protein S deficiency-currently on Lovenox.   Patient delivered a baby on Jan 09 uneventful.  However she complains of easy bruising significant pain at the site of injections.  She has lost weight/postpartum.   Denies any blood in stools or black or stools.  No nausea or vomiting.  No headaches.  Observation/objective: Alert & oriented x 3. In No acute distress.   Assessment and plan: Protein S deficiency affecting pregnancy (HCC) #Protein S deficiency-48% [60 to 150]; free protein S 39% [57-157].  Repeat testing-March 2021-protein S total antigen- 51%; protein S activity 39%.  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   #Patient delivered baby on Jan 09 without any complications.  Recommend total of 12 weeks of anticoagulation postpartum.  Given the easy bruising-recommend decreasing the dose to 100 mg subcu daily. [current weight 112 kg].  New refill sent to the pharmacy for 2 months and then stop.  # Anemia- Hb 10.4-ferritin- 5 - on pre-natal iron tablets.  We'll repeat labs at next  visit.  #Hypocalcemia-continue cramping vit D ca+ vit D 500/800 BID.   # DISPOSITION: # Follow up in 3 months- MD; labs- cbc/cmp;iron studies;ferritin---Dr.B      Follow-up instructions:  I discussed the assessment and treatment plan with the patient.  The patient was provided an opportunity to ask questions and all were answered.  The patient agreed with the plan and demonstrated understanding of instructions.  The patient was advised to call back or seek an in person evaluation if the symptoms worsen or if the condition fails to improve as anticipated. Dr. Louretta Shorten [facility] 02/02/2021 3:16 PM

## 2021-02-02 NOTE — Telephone Encounter (Signed)
RN Spoke with patient she is agreeable to do mychart visit at 3pm today with Dr. Donneta Romberg.

## 2021-02-02 NOTE — Telephone Encounter (Signed)
Pt just had a baby no one to care for him at the moment. Very tearful at checkin today. Can do a virtual appt but was more concerned about if she needs to still take the blood thinners she is on. Please advise.

## 2021-02-13 IMAGING — CR DG CHEST 2V
2 series · 2 of 2 positions shown · non-contrast
Comparison: Chest radiograph 04/29/2008

CLINICAL DATA: Shortness of breath postpartum.

EXAM:
CHEST - 2 VIEW

[chest pa]
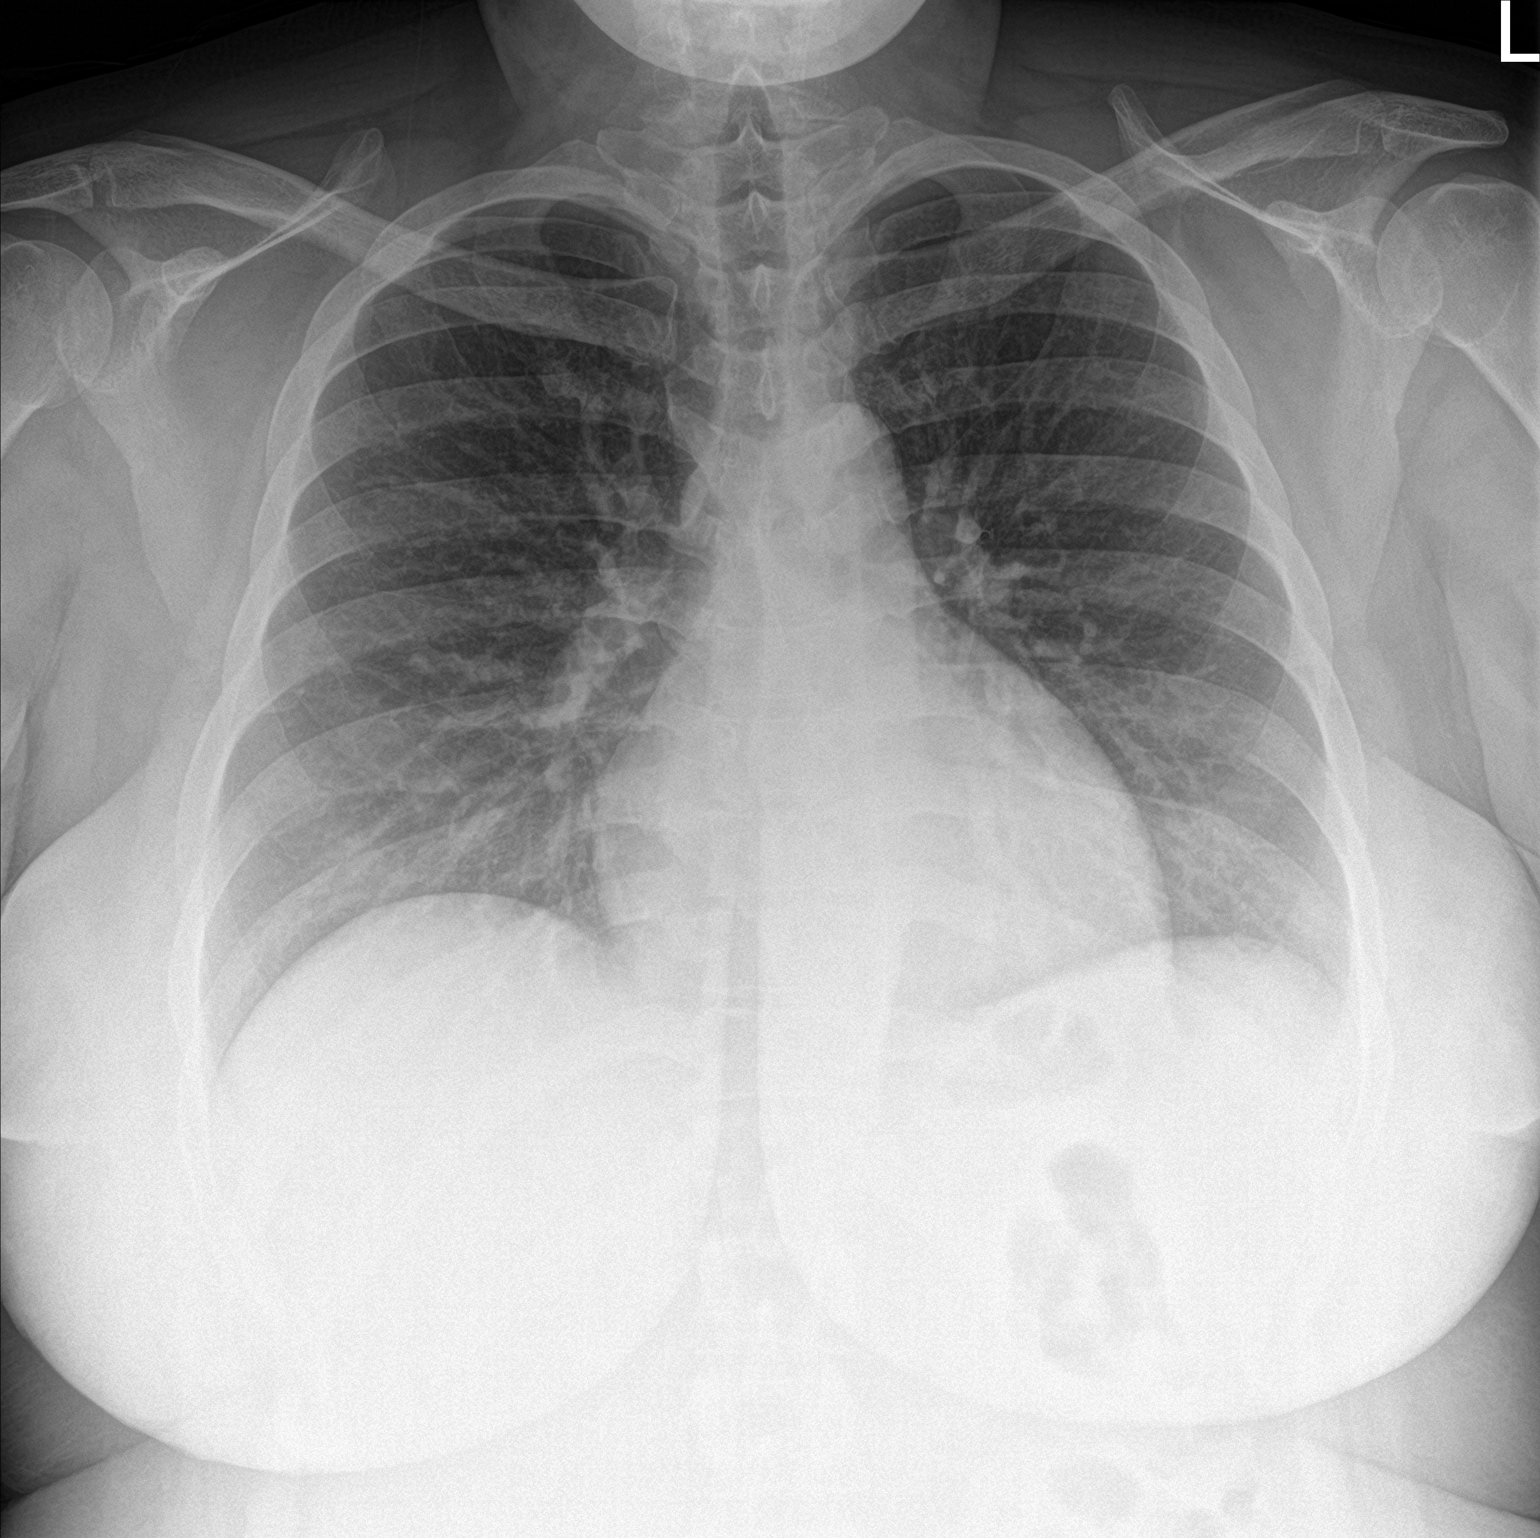

[chest lat]
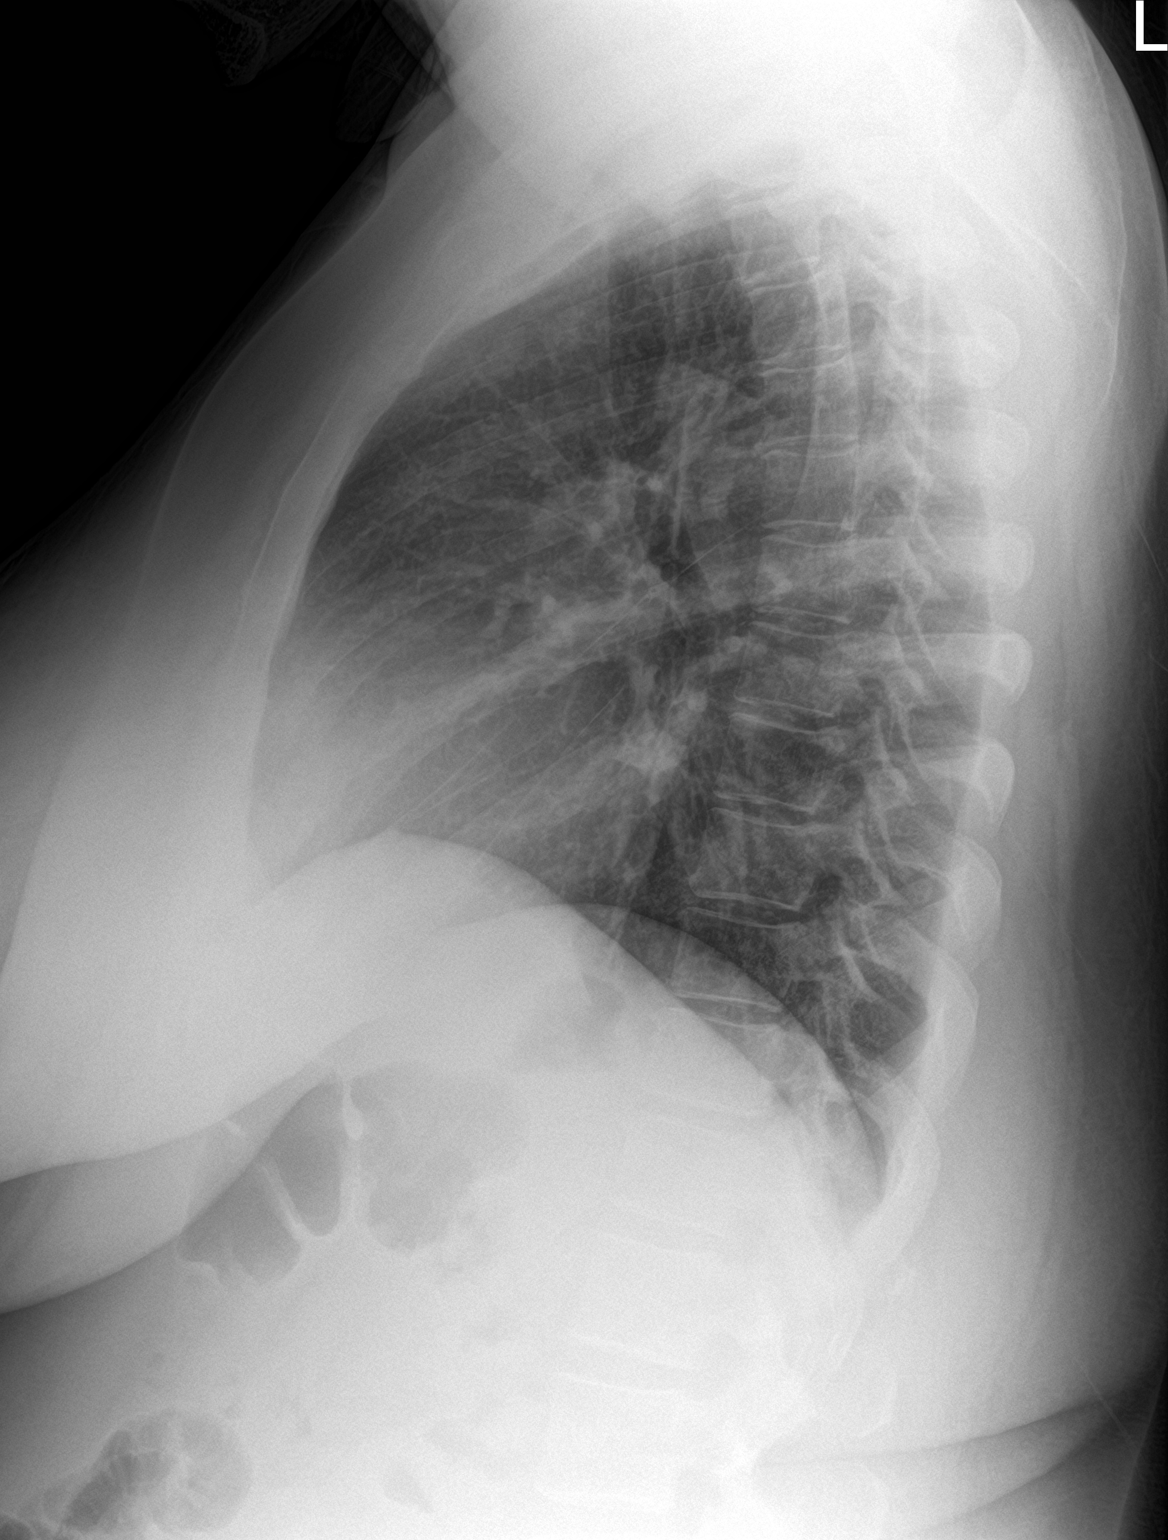

[2 of 2 positions shown; findings below may reference images not displayed]

FINDINGS: The cardiomediastinal contours are within normal limits. The lungs
are clear. No pneumothorax or pleural effusion. No acute finding in
the visualized skeleton.
IMPRESSION: No acute cardiopulmonary process.

## 2022-04-27 ENCOUNTER — Inpatient Hospital Stay (HOSPITAL_COMMUNITY): Payer: BC Managed Care – PPO

## 2022-04-27 ENCOUNTER — Encounter (HOSPITAL_COMMUNITY): Payer: Self-pay | Admitting: Obstetrics and Gynecology

## 2022-04-27 ENCOUNTER — Inpatient Hospital Stay (HOSPITAL_COMMUNITY): Payer: BC Managed Care – PPO | Admitting: Certified Registered Nurse Anesthetist

## 2022-04-27 ENCOUNTER — Other Ambulatory Visit: Payer: Self-pay

## 2022-04-27 ENCOUNTER — Observation Stay (HOSPITAL_COMMUNITY)
Admission: AD | Admit: 2022-04-27 | Discharge: 2022-04-28 | Disposition: A | Discharge status: Home / Self Care | Payer: BC Managed Care – PPO | Attending: Student | Admitting: Student

## 2022-04-27 ENCOUNTER — Encounter (HOSPITAL_COMMUNITY)
Admission: AD | Disposition: A | Discharge status: Home / Self Care | Payer: Self-pay | Source: Home / Self Care | Attending: Student

## 2022-04-27 DIAGNOSIS — K3531 Acute appendicitis with localized peritonitis and gangrene, without perforation: Secondary | ICD-10-CM | POA: Diagnosis not present

## 2022-04-27 DIAGNOSIS — Z3202 Encounter for pregnancy test, result negative: Secondary | ICD-10-CM | POA: Diagnosis not present

## 2022-04-27 DIAGNOSIS — K3580 Unspecified acute appendicitis: Secondary | ICD-10-CM | POA: Diagnosis present

## 2022-04-27 DIAGNOSIS — R1031 Right lower quadrant pain: Secondary | ICD-10-CM | POA: Diagnosis present

## 2022-04-27 DIAGNOSIS — R109 Unspecified abdominal pain: Secondary | ICD-10-CM | POA: Diagnosis not present

## 2022-04-27 DIAGNOSIS — K353 Acute appendicitis with localized peritonitis, without perforation or gangrene: Secondary | ICD-10-CM

## 2022-04-27 HISTORY — PX: LAPAROSCOPIC APPENDECTOMY: SHX408

## 2022-04-27 LAB — URINALYSIS, ROUTINE W REFLEX MICROSCOPIC
Bilirubin Urine: NEGATIVE
Glucose, UA: 50 mg/dL — AB
Ketones, ur: NEGATIVE mg/dL
Leukocytes,Ua: NEGATIVE
Nitrite: NEGATIVE
Protein, ur: 100 mg/dL — AB
Specific Gravity, Urine: 1.023 (ref 1.005–1.030)
pH: 8 (ref 5.0–8.0)

## 2022-04-27 LAB — COMPREHENSIVE METABOLIC PANEL
ALT: 14 U/L (ref 0–44)
AST: 15 U/L (ref 15–41)
Albumin: 3.9 g/dL (ref 3.5–5.0)
Alkaline Phosphatase: 90 U/L (ref 38–126)
Anion gap: 6 (ref 5–15)
BUN: 8 mg/dL (ref 6–20)
CO2: 23 mmol/L (ref 22–32)
Calcium: 9 mg/dL (ref 8.9–10.3)
Chloride: 106 mmol/L (ref 98–111)
Creatinine, Ser: 0.74 mg/dL (ref 0.44–1.00)
GFR, Estimated: >60 mL/min (ref >60)
Glucose, Bld: 143 mg/dL — ABNORMAL HIGH (ref 70–99)
Potassium: 3.8 mmol/L (ref 3.5–5.1)
Sodium: 135 mmol/L (ref 135–145)
Total Bilirubin: 0.4 mg/dL (ref 0.3–1.2)
Total Protein: 7.6 g/dL (ref 6.5–8.1)

## 2022-04-27 LAB — I-STAT BETA HCG BLOOD, ED (MC, WL, AP ONLY): I-stat hCG, quantitative: 7.3 m[IU]/mL — ABNORMAL HIGH (ref <5)

## 2022-04-27 LAB — CBC WITH DIFFERENTIAL/PLATELET
Abs Immature Granulocytes: 0.13 10*3/uL — ABNORMAL HIGH (ref 0.00–0.07)
Basophils Absolute: 0 10*3/uL (ref 0.0–0.1)
Basophils Relative: 0 %
Eosinophils Absolute: 0 10*3/uL (ref 0.0–0.5)
Eosinophils Relative: 0 %
HCT: 32.7 % — ABNORMAL LOW (ref 36.0–46.0)
Hemoglobin: 9.5 g/dL — ABNORMAL LOW (ref 12.0–15.0)
Immature Granulocytes: 1 %
Lymphocytes Relative: 6 %
Lymphs Abs: 1.2 10*3/uL (ref 0.7–4.0)
MCH: 19 pg — ABNORMAL LOW (ref 26.0–34.0)
MCHC: 29.1 g/dL — ABNORMAL LOW (ref 30.0–36.0)
MCV: 65.3 fL — ABNORMAL LOW (ref 80.0–100.0)
Monocytes Absolute: 0.5 10*3/uL (ref 0.1–1.0)
Monocytes Relative: 3 %
Neutro Abs: 17.8 10*3/uL — ABNORMAL HIGH (ref 1.7–7.7)
Neutrophils Relative %: 90 %
Platelets: 336 10*3/uL (ref 150–400)
RBC: 5.01 MIL/uL (ref 3.87–5.11)
RDW: 18.3 % — ABNORMAL HIGH (ref 11.5–15.5)
WBC: 19.6 10*3/uL — ABNORMAL HIGH (ref 4.0–10.5)
nRBC: 0 % (ref 0.0–0.2)

## 2022-04-27 LAB — LIPASE, BLOOD: Lipase: 30 U/L (ref 11–51)

## 2022-04-27 LAB — POCT PREGNANCY, URINE: Preg Test, Ur: NEGATIVE

## 2022-04-27 LAB — HIV ANTIBODY (ROUTINE TESTING W REFLEX): HIV Screen 4th Generation wRfx: NONREACTIVE

## 2022-04-27 SURGERY — APPENDECTOMY, LAPAROSCOPIC
Anesthesia: General | Site: Abdomen

## 2022-04-27 MED ORDER — ACETAMINOPHEN 500 MG PO TABS
1000.0000 mg | ORAL_TABLET | Freq: Four times a day (QID) | ORAL | Status: DC
  Administered 2022-04-27 – 2022-04-28 (×2): 1000 mg via ORAL
  Filled 2022-04-27 (×2): qty 2

## 2022-04-27 MED ORDER — DEXAMETHASONE SODIUM PHOSPHATE 10 MG/ML IJ SOLN
INTRAMUSCULAR | Status: AC
  Filled 2022-04-27: qty 2

## 2022-04-27 MED ORDER — ONDANSETRON HCL 4 MG/2ML IJ SOLN: 4.0000 mg | Freq: Four times a day (QID) | INTRAMUSCULAR | Status: DC | PRN

## 2022-04-27 MED ORDER — PIPERACILLIN-TAZOBACTAM 3.375 G IVPB 30 MIN: 3.3750 g | Freq: Three times a day (TID) | INTRAVENOUS | Status: DC

## 2022-04-27 MED ORDER — ENOXAPARIN SODIUM 60 MG/0.6ML IJ SOSY
60.0000 mg | PREFILLED_SYRINGE | INTRAMUSCULAR | Status: DC
  Filled 2022-04-27: qty 0.6

## 2022-04-27 MED ORDER — ROCURONIUM BROMIDE 10 MG/ML (PF) SYRINGE
PREFILLED_SYRINGE | INTRAVENOUS | Status: DC | PRN
  Administered 2022-04-27: 60 mg via INTRAVENOUS
  Administered 2022-04-27: 10 mg via INTRAVENOUS

## 2022-04-27 MED ORDER — LACTATED RINGERS IV SOLN: INTRAVENOUS | Status: DC

## 2022-04-27 MED ORDER — ACETAMINOPHEN 500 MG PO TABS
1000.0000 mg | ORAL_TABLET | ORAL | Status: AC
  Administered 2022-04-27: 1000 mg via ORAL
  Filled 2022-04-27: qty 2

## 2022-04-27 MED ORDER — IOHEXOL 300 MG/ML  SOLN
100.0000 mL | Freq: Once | INTRAMUSCULAR | Status: AC | PRN
  Administered 2022-04-27: 100 mL via INTRAVENOUS

## 2022-04-27 MED ORDER — ONDANSETRON 4 MG PO TBDP
8.0000 mg | ORAL_TABLET | Freq: Once | ORAL | Status: AC
  Administered 2022-04-27: 8 mg via ORAL
  Filled 2022-04-27: qty 2

## 2022-04-27 MED ORDER — OXYCODONE HCL 5 MG PO TABS
5.0000 mg | ORAL_TABLET | ORAL | Status: DC | PRN
  Filled 2022-04-27: qty 1

## 2022-04-27 MED ORDER — FENTANYL CITRATE PF 50 MCG/ML IJ SOSY
50.0000 ug | PREFILLED_SYRINGE | Freq: Once | INTRAMUSCULAR | Status: AC
  Administered 2022-04-27: 50 ug via INTRAVENOUS
  Filled 2022-04-27: qty 1

## 2022-04-27 MED ORDER — LIDOCAINE 2% (20 MG/ML) 5 ML SYRINGE
INTRAMUSCULAR | Status: DC | PRN
  Administered 2022-04-27: 60 mg via INTRAVENOUS

## 2022-04-27 MED ORDER — ONDANSETRON HCL 4 MG PO TABS
8.0000 mg | ORAL_TABLET | Freq: Once | ORAL | Status: DC
Start: 2022-04-27 — End: 2022-04-27

## 2022-04-27 MED ORDER — PHENYLEPHRINE 80 MCG/ML (10ML) SYRINGE FOR IV PUSH (FOR BLOOD PRESSURE SUPPORT)
PREFILLED_SYRINGE | INTRAVENOUS | Status: AC
  Filled 2022-04-27: qty 20

## 2022-04-27 MED ORDER — MIDAZOLAM HCL 2 MG/2ML IJ SOLN
INTRAMUSCULAR | Status: AC
  Filled 2022-04-27: qty 2

## 2022-04-27 MED ORDER — ONDANSETRON 4 MG PO TBDP
ORAL_TABLET | ORAL | Status: AC
  Filled 2022-04-27: qty 2

## 2022-04-27 MED ORDER — ONDANSETRON HCL 4 MG/2ML IJ SOLN
INTRAMUSCULAR | Status: AC
  Filled 2022-04-27: qty 4

## 2022-04-27 MED ORDER — SUGAMMADEX SODIUM 200 MG/2ML IV SOLN
INTRAVENOUS | Status: DC | PRN
  Administered 2022-04-27 (×2): 100 mg via INTRAVENOUS

## 2022-04-27 MED ORDER — SODIUM CHLORIDE 0.9 % IV BOLUS
1000.0000 mL | Freq: Once | INTRAVENOUS | Status: AC
  Administered 2022-04-27: 1000 mL via INTRAVENOUS

## 2022-04-27 MED ORDER — CHLORHEXIDINE GLUCONATE 0.12 % MT SOLN
15.0000 mL | Freq: Once | OROMUCOSAL | Status: AC
  Administered 2022-04-27: 15 mL via OROMUCOSAL
  Filled 2022-04-27: qty 15

## 2022-04-27 MED ORDER — BUPIVACAINE-EPINEPHRINE (PF) 0.25% -1:200000 IJ SOLN
INTRAMUSCULAR | Status: AC
  Filled 2022-04-27: qty 30

## 2022-04-27 MED ORDER — ONDANSETRON 4 MG PO TBDP: 4.0000 mg | ORAL_TABLET | Freq: Four times a day (QID) | ORAL | Status: DC | PRN

## 2022-04-27 MED ORDER — MIDAZOLAM HCL 2 MG/2ML IJ SOLN
INTRAMUSCULAR | Status: DC | PRN
  Administered 2022-04-27: 2 mg via INTRAVENOUS

## 2022-04-27 MED ORDER — DIPHENHYDRAMINE HCL 50 MG/ML IJ SOLN: 25.0000 mg | Freq: Four times a day (QID) | INTRAMUSCULAR | Status: DC | PRN

## 2022-04-27 MED ORDER — ORAL CARE MOUTH RINSE: 15.0000 mL | Freq: Once | OROMUCOSAL | Status: AC

## 2022-04-27 MED ORDER — CHLORHEXIDINE GLUCONATE CLOTH 2 % EX PADS: 6.0000 | MEDICATED_PAD | Freq: Once | CUTANEOUS | Status: DC

## 2022-04-27 MED ORDER — BUPIVACAINE-EPINEPHRINE 0.25% -1:200000 IJ SOLN
INTRAMUSCULAR | Status: DC | PRN
  Administered 2022-04-27: 19 mL

## 2022-04-27 MED ORDER — FENTANYL CITRATE (PF) 250 MCG/5ML IJ SOLN
INTRAMUSCULAR | Status: DC | PRN
  Administered 2022-04-27: 50 ug via INTRAVENOUS
  Administered 2022-04-27: 100 ug via INTRAVENOUS
  Administered 2022-04-27: 50 ug via INTRAVENOUS

## 2022-04-27 MED ORDER — ONDANSETRON HCL 4 MG/2ML IJ SOLN
INTRAMUSCULAR | Status: DC | PRN
  Administered 2022-04-27: 4 mg via INTRAVENOUS

## 2022-04-27 MED ORDER — DIPHENHYDRAMINE HCL 25 MG PO CAPS: 25.0000 mg | ORAL_CAPSULE | Freq: Four times a day (QID) | ORAL | Status: DC | PRN

## 2022-04-27 MED ORDER — LIDOCAINE 2% (20 MG/ML) 5 ML SYRINGE
INTRAMUSCULAR | Status: AC
  Filled 2022-04-27: qty 10

## 2022-04-27 MED ORDER — GABAPENTIN 300 MG PO CAPS
300.0000 mg | ORAL_CAPSULE | ORAL | Status: AC
  Administered 2022-04-27: 300 mg via ORAL
  Filled 2022-04-27: qty 1

## 2022-04-27 MED ORDER — PHENOL 1.4 % MT LIQD
1.0000 | OROMUCOSAL | Status: DC | PRN
  Filled 2022-04-27: qty 177

## 2022-04-27 MED ORDER — ONDANSETRON HCL 4 MG/2ML IJ SOLN
4.0000 mg | Freq: Once | INTRAMUSCULAR | Status: AC
  Administered 2022-04-27: 4 mg via INTRAVENOUS
  Filled 2022-04-27: qty 2

## 2022-04-27 MED ORDER — DEXAMETHASONE SODIUM PHOSPHATE 10 MG/ML IJ SOLN
INTRAMUSCULAR | Status: DC | PRN
  Administered 2022-04-27: 5 mg via INTRAVENOUS

## 2022-04-27 MED ORDER — ROCURONIUM BROMIDE 10 MG/ML (PF) SYRINGE
PREFILLED_SYRINGE | INTRAVENOUS | Status: AC
  Filled 2022-04-27: qty 20

## 2022-04-27 MED ORDER — ESMOLOL HCL 100 MG/10ML IV SOLN
INTRAVENOUS | Status: AC
  Filled 2022-04-27: qty 10

## 2022-04-27 MED ORDER — 0.9 % SODIUM CHLORIDE (POUR BTL) OPTIME
TOPICAL | Status: DC | PRN
  Administered 2022-04-27: 1000 mL

## 2022-04-27 MED ORDER — ENOXAPARIN SODIUM 40 MG/0.4ML IJ SOSY: 40.0000 mg | PREFILLED_SYRINGE | INTRAMUSCULAR | Status: DC

## 2022-04-27 MED ORDER — FENTANYL CITRATE (PF) 250 MCG/5ML IJ SOLN
INTRAMUSCULAR | Status: AC
  Filled 2022-04-27: qty 5

## 2022-04-27 MED ORDER — EPHEDRINE 5 MG/ML INJ
INTRAVENOUS | Status: AC
  Filled 2022-04-27: qty 5

## 2022-04-27 MED ORDER — FENTANYL CITRATE (PF) 100 MCG/2ML IJ SOLN: 25.0000 ug | INTRAMUSCULAR | Status: DC | PRN

## 2022-04-27 MED ORDER — PIPERACILLIN-TAZOBACTAM 3.375 G IVPB 30 MIN
3.3750 g | Freq: Once | INTRAVENOUS | Status: AC
  Administered 2022-04-27: 3.375 g via INTRAVENOUS
  Filled 2022-04-27: qty 50

## 2022-04-27 MED ORDER — SODIUM CHLORIDE 0.9 % IR SOLN
Status: DC | PRN
  Administered 2022-04-27: 1000 mL

## 2022-04-27 MED ORDER — PROPOFOL 10 MG/ML IV BOLUS
INTRAVENOUS | Status: DC | PRN
  Administered 2022-04-27: 170 mg via INTRAVENOUS

## 2022-04-27 MED ORDER — CEFAZOLIN SODIUM-DEXTROSE 2-3 GM-%(50ML) IV SOLR
INTRAVENOUS | Status: DC | PRN
  Administered 2022-04-27: 2 g via INTRAVENOUS

## 2022-04-27 MED ORDER — DOCUSATE SODIUM 100 MG PO CAPS
100.0000 mg | ORAL_CAPSULE | Freq: Two times a day (BID) | ORAL | Status: DC
  Filled 2022-04-27 (×2): qty 1

## 2022-04-27 MED ORDER — HYDROMORPHONE HCL 1 MG/ML IJ SOLN: 0.5000 mg | INTRAMUSCULAR | Status: DC | PRN

## 2022-04-27 SURGICAL SUPPLY — 47 items
ADH SKN CLS APL DERMABOND .7 (GAUZE/BANDAGES/DRESSINGS) ×1
APL PRP STRL LF DISP 70% ISPRP (MISCELLANEOUS) ×1
APPLIER CLIP 5 13 M/L LIGAMAX5 (MISCELLANEOUS)
APR CLP MED LRG 5 ANG JAW (MISCELLANEOUS)
BAG COUNTER SPONGE SURGICOUNT (BAG) ×3 IMPLANT
BAG SPEC RTRVL LRG 6X4 10 (ENDOMECHANICALS) ×1
BAG SPNG CNTER NS LX DISP (BAG) ×1
BLADE CLIPPER SURG (BLADE) IMPLANT
CANISTER SUCT 3000ML PPV (MISCELLANEOUS) ×3 IMPLANT
CHLORAPREP W/TINT 26 (MISCELLANEOUS) ×3 IMPLANT
CLIP APPLIE 5 13 M/L LIGAMAX5 (MISCELLANEOUS) IMPLANT
COVER SURGICAL LIGHT HANDLE (MISCELLANEOUS) ×3 IMPLANT
CUTTER FLEX LINEAR 45M (STAPLE) ×1 IMPLANT
DERMABOND ADVANCED (GAUZE/BANDAGES/DRESSINGS) ×1
DERMABOND ADVANCED .7 DNX12 (GAUZE/BANDAGES/DRESSINGS) ×2 IMPLANT
ELECT REM PT RETURN 9FT ADLT (ELECTROSURGICAL) ×2
ELECTRODE REM PT RTRN 9FT ADLT (ELECTROSURGICAL) ×2 IMPLANT
ENDOLOOP SUT PDS II  0 18 (SUTURE)
ENDOLOOP SUT PDS II 0 18 (SUTURE) IMPLANT
GLOVE BIOGEL PI MICRO 5.5 (GLOVE) ×1
GLOVE BIOGEL PI MICRO STRL 5.5 (GLOVE) ×2 IMPLANT
GLOVE SURG UNDER POLY LF SZ6 (GLOVE) ×3 IMPLANT
GOWN STRL REUS W/ TWL LRG LVL3 (GOWN DISPOSABLE) ×6 IMPLANT
GOWN STRL REUS W/TWL LRG LVL3 (GOWN DISPOSABLE) ×6
GRASPER SUT TROCAR 14GX15 (MISCELLANEOUS) ×1 IMPLANT
KIT BASIN OR (CUSTOM PROCEDURE TRAY) ×3 IMPLANT
KIT TURNOVER KIT B (KITS) ×3 IMPLANT
NS IRRIG 1000ML POUR BTL (IV SOLUTION) ×3 IMPLANT
PAD ARMBOARD 7.5X6 YLW CONV (MISCELLANEOUS) ×6 IMPLANT
PENCIL BUTTON HOLSTER BLD 10FT (ELECTRODE) ×3 IMPLANT
POUCH SPECIMEN RETRIEVAL 10MM (ENDOMECHANICALS) ×3 IMPLANT
RELOAD STAPLE 45 3.5 BLU ETS (ENDOMECHANICALS) IMPLANT
RELOAD STAPLE TA45 3.5 REG BLU (ENDOMECHANICALS) ×2 IMPLANT
SCISSORS LAP 5X35 DISP (ENDOMECHANICALS) IMPLANT
SET IRRIG TUBING LAPAROSCOPIC (IRRIGATION / IRRIGATOR) IMPLANT
SET TUBE SMOKE EVAC HIGH FLOW (TUBING) ×3 IMPLANT
SHEARS HARMONIC ACE PLUS 36CM (ENDOMECHANICALS) ×1 IMPLANT
SLEEVE ENDOPATH XCEL 5M (ENDOMECHANICALS) ×3 IMPLANT
SPECIMEN JAR SMALL (MISCELLANEOUS) ×3 IMPLANT
SUT MNCRL AB 4-0 PS2 18 (SUTURE) ×3 IMPLANT
TOWEL GREEN STERILE (TOWEL DISPOSABLE) ×3 IMPLANT
TOWEL GREEN STERILE FF (TOWEL DISPOSABLE) ×3 IMPLANT
TRAY FOLEY W/BAG SLVR 14FR (SET/KITS/TRAYS/PACK) IMPLANT
TRAY LAPAROSCOPIC MC (CUSTOM PROCEDURE TRAY) ×3 IMPLANT
TROCAR XCEL BLUNT TIP 100MML (ENDOMECHANICALS) ×3 IMPLANT
TROCAR XCEL NON-BLD 5MMX100MML (ENDOMECHANICALS) ×3 IMPLANT
WATER STERILE IRR 1000ML POUR (IV SOLUTION) ×3 IMPLANT

## 2022-04-27 NOTE — Anesthesia Procedure Notes (Signed)
Procedure Name: Intubation ?Date/Time: 04/27/2022 4:35 PM ?Performed by: Trinna Post., CRNA ?Pre-anesthesia Checklist: Patient identified, Emergency Drugs available, Suction available, Patient being monitored and Timeout performed ?Patient Re-evaluated:Patient Re-evaluated prior to induction ?Oxygen Delivery Method: Circle system utilized ?Preoxygenation: Pre-oxygenation with 100% oxygen ?Induction Type: IV induction ?Ventilation: Mask ventilation without difficulty ?Laryngoscope Size: Mac and 3 ?Grade View: Grade I ?Tube type: Oral ?Tube size: 7.0 mm ?Number of attempts: 1 ?Airway Equipment and Method: Stylet ?Placement Confirmation: positive ETCO2, breath sounds checked- equal and bilateral and ETT inserted through vocal cords under direct vision ?Secured at: 22 cm ?Tube secured with: Tape ?Dental Injury: Teeth and Oropharynx as per pre-operative assessment  ? ? ? ? ?

## 2022-04-27 NOTE — MAU Note (Signed)
..  [name] is a 41 y.o. at Unknown here in MAU reporting: N/V has thrown up so much she can't even count. Has not taken a home pregnancy test. Reports lower right abdominal pain that is stabbing, sharp and is constant. ?Denies vaginal bleeding or leaking of fluid.  ?LMP: can not remember ?Onset of complaint: today and sudden ?Pain score: 10/10 ?Vitals:  ? 04/27/22 0119  ?BP: 113/90  ?Pulse: 89  ?Resp: (!) 21  ?Temp: 98.4 ?F (36.9 ?C)  ?SpO2: 100%  ?   ?FHT:not indicated ?Lab orders placed from triage: UA ? ?

## 2022-04-27 NOTE — ED Triage Notes (Signed)
Pt here with with RLQ 10/10 abdominal pain. Pt with nausea and vomiting. Pain started a couple of days ago and N/V started today. Pt states she has thrown up 50 times. ?

## 2022-04-27 NOTE — Anesthesia Preprocedure Evaluation (Addendum)
Anesthesia Evaluation  ?Patient identified by MRN, date of birth, ID band ?Patient awake ? ? ? ?Reviewed: ?Allergy & Precautions, NPO status , Patient's Chart, lab work & pertinent test results ? ?Airway ?Mallampati: II ? ?TM Distance: >3 FB ?Neck ROM: Full ? ? ? Dental ?no notable dental hx. ? ?  ?Pulmonary ?neg pulmonary ROS,  ?  ?Pulmonary exam normal ? ? ? ? ? ? ? Cardiovascular ?negative cardio ROS ? ? ?Rhythm:Regular Rate:Normal ? ? ?  ?Neuro/Psych ?negative neurological ROS ? negative psych ROS  ? GI/Hepatic ?Neg liver ROS, Acute appendicitis  ?  ?Endo/Other  ?negative endocrine ROS ? Renal/GU ?negative Renal ROS  ?negative genitourinary ?  ?Musculoskeletal ?negative musculoskeletal ROS ?(+)  ? Abdominal ?Normal abdominal exam  (+)   ?Peds ? Hematology ? ?(+) Blood dyscrasia, anemia ,   ?Anesthesia Other Findings ? ? Reproductive/Obstetrics ?(+) Pregnancy ?UPreg negative, ISTAT Quant + (7.6) ? ?  ? ? ? ? ? ? ? ? ? ? ? ? ? ?  ?  ? ? ? ? ? ? ?Anesthesia Physical ?Anesthesia Plan ? ?ASA: 2 ? ?Anesthesia Plan: General  ? ?Post-op Pain Management:   ? ?Induction: Intravenous ? ?PONV Risk Score and Plan: 3 and Ondansetron, Dexamethasone, Midazolam and Treatment may vary due to age or medical condition ? ?Airway Management Planned: Mask and Oral ETT ? ?Additional Equipment: None ? ?Intra-op Plan:  ? ?Post-operative Plan: Extubation in OR ? ?Informed Consent: I have reviewed the patients History and Physical, chart, labs and discussed the procedure including the risks, benefits and alternatives for the proposed anesthesia with the patient or authorized representative who has indicated his/her understanding and acceptance.  ? ? ? ?Dental advisory given ? ?Plan Discussed with: CRNA ? ?Anesthesia Plan Comments: (Lab Results ?     Component                Value               Date                 ?     PREGTESTUR               NEGATIVE            04/27/2022           ?     HCG                       7.3 (H)             04/27/2022          )  ? ? ? ? ? ?Anesthesia Quick Evaluation ? ?

## 2022-04-27 NOTE — Op Note (Signed)
Date: 04/27/22 ? ?Patient: [name] ?MRN: [id number] ? ?Preoperative Diagnosis: Acute appendicitis ?Postoperative Diagnosis: Gangrenous appendicitis ? ?Procedure: Laparoscopic appendectomy ? ?Surgeon: Sophronia Simas, MD ? ?EBL: Minimal ? ?Anesthesia: General endotracheal ? ?Specimens: Appendix ? ?Indications: [name] is a 41 yo female who presented to the ED with several days of worsening right sided abdominal pain. Labs were significant for leukocytosis, as well as a very mildly elevated hCG, and a CT scan showed acute appendicitis.  The patient agreed to proceed with appendectomy. ? ?Findings: Gangrenous appendicitis. Cecum and appendix located in the RUQ. ? ?Procedure details: Informed consent was obtained in the preoperative area prior to the procedure. The patient was brought to the operating room and placed on the table in the supine position. General anesthesia was induced and appropriate lines and drains were placed for intraoperative monitoring. Perioperative antibiotics were administered per SCIP guidelines. The abdomen was prepped and draped in the usual sterile fashion. A pre-procedure timeout was taken verifying patient identity, surgical site and procedure to be performed. ? ?A small infraumbilical skin incision was made and the subcutaneous tissue was spread to expose the fascia. The umbilical stalk was grasped and elevated, and a Veress needle was inserted through the fascia.  Intraperitoneal placement was confirmed with the saline drop test and the abdomen was insufflated.  A 12 mm port was placed, and the abdomen was inspected with no evidence of visceral or vascular injury. A suprapubic 70mm port was placed, followed by a 41mm port in the LLQ, both under direct visualization.  There was some turbid fluid in the right upper quadrant around the liver, and the cecum was visualized in the right upper quadrant.  The appendix was not immediately visible, but there was clearly an inflammatory rind  adjacent to the cecum, between the cecum and the liver.  The terminal ileum was adherent to the right abdominal sidewall.  It was taken down off the sidewall using harmonic shears, taking care not to injure the small bowel.  Because of the location of the cecum and the right upper quadrant, an additional 104mm port was placed in the left upper quadrant.  This allowed further mobilization of the cecum off the retroperitoneum.  The base of the appendix could then be visualized.  There was adjacent omental fat adherent to the top of the appendix which was bluntly dissected off.  This exposed the entire appendix, which was curled up in the right upper quadrant adjacent to the cecum.  The mesoappendix was divided with harmonic shears.  The appendix was then stapled at the base from the cecum using a blue load of 45 mm stapler.  The appendix was placed in an Endo Catch bag and removed and sent for routine pathology.  The surgical site was irrigated. The staple line was inspected and appeared in tact with no bleeding or leakage.  There was a small serosal tear on the cecum, which was oversewn and buttressed with 3-0 silk pursestring suture.  The abdomen was irrigated and appeared hemostatic.  The ports were removed and the pneumoperitoneum was evacuated. The umbilical port site fascia was closed with a 0 Vicryl suture. The skin at all port sites was closed with 4-0 monocryl subcuticular suture. Dermabond was applied. ? ?The patient tolerated the procedure well with no apparent complications.  All counts were correct x2 at the end of the procedure. The patient was extubated and taken to PACU in stable condition. ? ?Sophronia Simas, MD ?04/27/22 ?6:31 PM ? ? ?

## 2022-04-27 NOTE — ED Notes (Signed)
Patient transported to CT 

## 2022-04-27 NOTE — Transfer of Care (Signed)
Immediate Anesthesia Transfer of Care Note ? ?Patient: [name] ? ?Procedure(s) Performed: APPENDECTOMY LAPAROSCOPIC (Abdomen) ? ?Patient Location: PACU ? ?Anesthesia Type:General ? ?Level of Consciousness: awake, alert , oriented and drowsy ? ?Airway & Oxygen Therapy: Patient Spontanous Breathing and Patient connected to nasal cannula oxygen ? ?Post-op Assessment: Report given to RN and Post -op Vital signs reviewed and stable ? ?Post vital signs: Reviewed and stable ? ?Last Vitals:  ?Vitals Value Taken Time  ?BP 116/63 04/27/22 1826  ?Temp    ?Pulse 103 04/27/22 1827  ?Resp 19 04/27/22 1827  ?SpO2 88 % 04/27/22 1827  ?Vitals shown include unvalidated device data. ? ?Last Pain:  ?Vitals:  ? 04/27/22 1310  ?TempSrc: Oral  ?PainSc: 8   ?   ? ?Patients Stated Pain Goal: 0 (04/27/22 0118) ? ?Complications: No notable events documented. ?

## 2022-04-27 NOTE — ED Provider Notes (Addendum)
?[facility] EMERGENCY DEPARTMENT ?Provider Note ? ? ?CSN: [id number] ?Arrival date & time: 04/27/22  0103 ? ?  ? ?History ? ?Chief Complaint  ?Patient presents with  ? Abdominal Pain  ? Nausea  ? Emesis  ? ? ?[name] is a 41 y.o. female with chief complaint of right lower quadrant and diffuse abdominal pain over the last week.  Pain rated 1000/10.  Nausea and vomiting started last night.  Patient states she has vomited about 50 times, denies hemoptysis.  Endorses chills, but denies fever.  Denies dysuria, diarrhea, constipation.  Denies vaginal discharge or bleeding, or pelvic pain.  Endorses right flank pain.  No Hx of prior kidney stones.   ? ?Hx protein S deficiency and previous C-sections. ? ?The history is provided by the patient and medical records.  ?Abdominal Pain ?Associated symptoms: chills, nausea and vomiting   ?Emesis ?Associated symptoms: abdominal pain and chills   ? ?  ? ?Home Medications ?Prior to Admission medications   ?Medication Sig Start Date End Date Taking? Authorizing Provider  ?acetaminophen (TYLENOL) 500 MG tablet Take 1,000 mg by mouth 2 (two) times daily as needed (pain.).    [provider]  ?enoxaparin (LOVENOX) 100 MG/ML injection Inject 1 mL (100 mg total) into the skin daily. 02/02/21   Earna Coder, MD  ?iron polysaccharides (NIFEREX) 150 MG capsule Take 150 mg by mouth daily. 11/13/20   [provider]  ?oxyCODONE (OXY IR/ROXICODONE) 5 MG immediate release tablet Take 1-2 tablets (5-10 mg total) by mouth every 4 (four) hours as needed for moderate pain. 01/02/21   Maxie Better, MD  ?Prenatal MV & Min w/FA-DHA (PRENATAL GUMMIES PO) Take 2 tablets by mouth every evening.    [provider]  ?   ? ?Allergies    ?Patient has no known allergies.   ? ?Review of Systems   ?Review of Systems  ?Constitutional:  Positive for chills.  ?Gastrointestinal:  Positive for abdominal pain, nausea and vomiting.  ? ?Physical  Exam ?Updated Vital Signs ?BP 124/74   Pulse 99   Temp 98.5 ?F (36.9 ?C) (Oral)   Resp 17   Ht 5\' 6"  (1.676 m)   Wt 117.9 kg   LMP 04/11/2022   SpO2 97%   BMI 41.97 kg/m?  ?Physical Exam ?Vitals and nursing note reviewed.  ?Constitutional:   ?   General: She is not in acute distress. ?   Appearance: She is well-developed. She is ill-appearing. She is not diaphoretic.  ?HENT:  ?   Head: Normocephalic and atraumatic.  ?   Mouth/Throat:  ?   Pharynx: Oropharynx is clear.  ?Eyes:  ?   General: No scleral icterus. ?   Conjunctiva/sclera: Conjunctivae normal.  ?Cardiovascular:  ?   Rate and Rhythm: Normal rate and regular rhythm.  ?   Pulses: Normal pulses.  ?   Heart sounds: Normal heart sounds. No murmur heard. ?Pulmonary:  ?   Effort: Pulmonary effort is normal. No respiratory distress.  ?   Breath sounds: Normal breath sounds. No wheezing.  ?Chest:  ?   Chest wall: No tenderness.  ?Abdominal:  ?   General: Bowel sounds are normal.  ?   Palpations: Abdomen is soft.  ?   Tenderness: There is generalized abdominal tenderness (Significant) and tenderness in the right lower quadrant. There is guarding and rebound.  ?Musculoskeletal:     ?   General: No swelling.  ?   Cervical back: Neck supple.  ?Skin: ?  General: Skin is warm and dry.  ?   Capillary Refill: Capillary refill takes less than 2 seconds.  ?Neurological:  ?   Mental Status: She is alert and oriented to person, place, and time.  ?Psychiatric:     ?   Mood and Affect: Mood normal.  ? ? ?ED Results / Procedures / Treatments   ?Labs ?(all labs ordered are listed, but only abnormal results are displayed) ?Labs Reviewed  ?COMPREHENSIVE METABOLIC PANEL - Abnormal; Notable for the following components:  ?    Result Value  ? Glucose, Bld 143 (*)   ? All other components within normal limits  ?CBC WITH DIFFERENTIAL/PLATELET - Abnormal; Notable for the following components:  ? WBC 19.6 (*)   ? Hemoglobin 9.5 (*)   ? HCT 32.7 (*)   ? MCV 65.3 (*)   ? MCH 19.0 (*)    ? MCHC 29.1 (*)   ? RDW 18.3 (*)   ? Neutro Abs 17.8 (*)   ? Abs Immature Granulocytes 0.13 (*)   ? All other components within normal limits  ?URINALYSIS, ROUTINE W REFLEX MICROSCOPIC - Abnormal; Notable for the following components:  ? Glucose, UA 50 (*)   ? Hgb urine dipstick SMALL (*)   ? Protein, ur 100 (*)   ? Bacteria, UA RARE (*)   ? All other components within normal limits  ?I-STAT BETA HCG BLOOD, ED ([facility], [facility], [facility] ONLY) - Abnormal; Notable for the following components:  ? I-stat hCG, quantitative 7.3 (*)   ? All other components within normal limits  ?URINE CULTURE  ?LIPASE, BLOOD  ?POCT PREGNANCY, URINE  ? ? ?EKG ?None ? ?Radiology ?CT ABDOMEN PELVIS W CONTRAST ? ?Result Date: 04/27/2022 ?CLINICAL DATA:  41 year old female with right lower quadrant abdominal pain. Flank pain. EXAM: CT ABDOMEN AND PELVIS WITH CONTRAST TECHNIQUE: Multidetector CT imaging of the abdomen and pelvis was performed using the standard protocol following bolus administration of intravenous contrast. RADIATION DOSE REDUCTION: This exam was performed according to the departmental dose-optimization program which includes automated exposure control, adjustment of the mA and/or kV according to patient size and/or use of iterative reconstruction technique. CONTRAST:  100mL OMNIPAQUE IOHEXOL 300 MG/ML  SOLN COMPARISON:  CT Abdomen and Pelvis 06/14/2007. CTA chest 01/06/2021. FINDINGS: Lower chest: Negative. Hepatobiliary: Negative liver and gallbladder. Pancreas: Negative. Spleen: Negative. Adrenals/Urinary Tract: Normal left adrenal gland. Nodular, oval enlarged right adrenal gland up to 3.2 cm with 40 Hounsfield units internal density today, -6 Hounsfield units on the 2022 CTA. Size appears stable since 2022. Kidneys appears symmetric and negative. Diminutive and negative bladder. Occasional pelvic phleboliths. Stomach/Bowel: Mostly decompressed large bowel throughout the abdomen and pelvis. Inflamed appendix. Appendix: Location:  Retrocecal and lateral to the cecum (series 4, image 53) Diameter: Up to 15 mm Appendicolith: Probable (series 4, image 57). Mucosal hyper-enhancement: Mild Extraluminal gas: Negative Periappendiceal collection: Regional mesenteric inflammation with no organized fluid collection. Trace free fluid in the right gutter. Terminal ileum is decompressed and appears relatively spared. No dilated large bowel. Largely decompressed stomach. No free air. Vascular/Lymphatic: Suboptimal intravascular contrast bolus but the major arterial structures appear to be patent. No calcified atherosclerosis or lymphadenopathy identified. Reproductive: Negative. Other: No pelvic free fluid. Musculoskeletal: No acute osseous abnormality identified. IMPRESSION: 1. Positive for Acute Appendicitis. Probable appendicolith. Periappendiceal inflammation with no evidence of perforation or abscess. 2. 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 Reference: JACR 2017 Aug; 14(8):1038-44, JCAT 2016  Mar-Apr; 40(2):194-200. Electronically Signed   By: Odessa Fleming M.D.   On: 04/27/2022 07:28   ? ?Procedures ?Procedures  ? ? ?Medications Ordered in ED ?Medications  ?piperacillin-tazobactam (ZOSYN) IVPB 3.375 g (has no administration in time range)  ?ondansetron (ZOFRAN-ODT) disintegrating tablet 8 mg (8 mg Oral Given 04/27/22 0220)  ?sodium chloride 0.9 % bolus 1,000 mL (1,000 mLs Intravenous New Bag/Given 04/27/22 0850)  ?fentaNYL (SUBLIMAZE) injection 50 mcg (50 mcg Intravenous Given 04/27/22 0853)  ?ondansetron [facility]) injection 4 mg (4 mg Intravenous Given 04/27/22 0850)  ?iohexol (OMNIPAQUE) 300 MG/ML solution 100 mL (100 mLs Intravenous Contrast Given 04/27/22 0716)  ? ? ?ED Course/ Medical Decision Making/ A&P ?  ?                        ?Medical Decision Making ?Amount and/or Complexity of Data Reviewed ?External Data Reviewed: notes. ?Labs: ordered. Decision-making details documented in ED  Course. ?Radiology: ordered and independent interpretation performed. Decision-making details documented in ED Course. ?ECG/medicine tests: ordered and independent interpretation performed. Decision-making details documented in ED Cou

## 2022-04-27 NOTE — Discharge Instructions (Signed)
[facility] LAPAROSCOPIC SURGERY: POST OP INSTRUCTIONS Always review your discharge instruction sheet given to you by the facility where your surgery was performed. IF YOU HAVE DISABILITY OR FAMILY LEAVE FORMS, YOU MUST BRING THEM TO THE OFFICE FOR PROCESSING.   DO NOT GIVE THEM TO YOUR DOCTOR.  PAIN CONTROL  First take acetaminophen (Tylenol) AND/or ibuprofen (Advil) to control your pain after surgery.  Follow directions on package.  Taking acetaminophen (Tylenol) and/or ibuprofen (Advil) regularly after surgery will help to control your pain and lower the amount of prescription pain medication you may need.  You should not take more than 3,000 mg (3 grams) of acetaminophen (Tylenol) in 24 hours.  You should not take ibuprofen (Advil), aleve, motrin, naprosyn or other NSAIDS if you have a history of stomach ulcers or chronic kidney disease.  A prescription for pain medication may be given to you upon discharge.  Take your pain medication as prescribed, if you still have uncontrolled pain after taking acetaminophen (Tylenol) or ibuprofen (Advil). Use ice packs to help control pain. If you need a refill on your pain medication, please contact your pharmacy.  They will contact our office to request authorization. Prescriptions will not be filled after 5pm or on week-ends.  HOME MEDICATIONS Take your usually prescribed medications unless otherwise directed.  DIET You should follow a light diet the first few days after arrival home.  Be sure to include lots of fluids daily. Avoid fatty, fried foods.   CONSTIPATION It is common to experience some constipation after surgery and if you are taking pain medication.  Increasing fluid intake and taking a stool softener (such as Colace) will usually help or prevent this problem from occurring.  A mild laxative (Milk of Magnesia or Miralax) should be taken according to package instructions if there are no bowel movements after 48  hours.  WOUND/INCISION CARE Most patients will experience some swelling and bruising in the area of the incisions.  Ice packs will help.  Swelling and bruising can take several days to resolve.  Unless discharge instructions indicate otherwise, follow guidelines below  STERI-STRIPS - you may remove your outer bandages 48 hours after surgery, and you may shower at that time.  You have steri-strips (small skin tapes) in place directly over the incision.  These strips should be left on the skin for 7-10 days.   DERMABOND/SKIN GLUE - you may shower in 24 hours.  The glue will flake off over the next 2-3 weeks. Any sutures or staples will be removed at the office during your follow-up visit.  ACTIVITIES You may resume regular (light) daily activities beginning the next day--such as daily self-care, walking, climbing stairs--gradually increasing activities as tolerated.  You may have sexual intercourse when it is comfortable.  Refrain from any heavy lifting or straining until approved by your doctor. You may drive when you are no longer taking prescription pain medication, you can comfortably wear a seatbelt, and you can safely maneuver your car and apply brakes.  FOLLOW-UP You should see your doctor in the office for a follow-up appointment approximately 2-3 weeks after your surgery.  You should have been given your post-op/follow-up appointment when your surgery was scheduled.  If you did not receive a post-op/follow-up appointment, make sure that you call for this appointment within a day or two after you arrive home to insure a convenient appointment time.   WHEN TO CALL YOUR DOCTOR: Fever over 101.0 Inability to urinate Continued bleeding from incision.   Increased pain, redness, or drainage from the incision. Increasing abdominal pain  The clinic staff is available to answer your questions during regular business hours.  Please don't hesitate to call and ask to speak to one of the nurses for  clinical concerns.  If you have a medical emergency, go to the nearest emergency room or call 911.  A surgeon from [facility] is always on call at the hospital. [address], [address], [place], [place]  [address] ? [address] [address], [place], [place]   [address] [phone] ? [phone] ? FAX [phone] Web site: [email or web address]  

## 2022-04-27 NOTE — ED Notes (Signed)
Ambulated to bathroom. Gait steady. Continues to guard abdomen. Obvious discomfort noted.  ?

## 2022-04-27 NOTE — Anesthesia Postprocedure Evaluation (Signed)
Anesthesia Post Note ? ?Patient: [name] ? ?Procedure(s) Performed: APPENDECTOMY LAPAROSCOPIC (Abdomen) ? ?  ? ?Patient location during evaluation: PACU ?Anesthesia Type: General ?Level of consciousness: awake ?Pain management: pain level controlled ?Vital Signs Assessment: post-procedure vital signs reviewed and stable ?Cardiovascular status: stable ?Postop Assessment: no apparent nausea or vomiting ?Anesthetic complications: no ? ? ?No notable events documented. ? ?Last Vitals:  ?Vitals:  ? 04/27/22 1830 04/27/22 1841  ?BP:  109/69  ?Pulse:  96  ?Resp:  16  ?Temp:    ?SpO2: 93% 94%  ?  ?Last Pain:  ?Vitals:  ? 04/27/22 1841  ?TempSrc:   ?PainSc: 0-No pain  ? ? ?  ?  ?  ?  ?  ?  ? ?Jobin Montelongo ? ? ? ? ?

## 2022-04-27 NOTE — MAU Provider Note (Signed)
?  Chief Complaint: Abdominal Pain, Nausea, and Emesis ? ? None  ?  ? ?SUBJECTIVE ?HPI: [name] is a 41 y.o. [id number] who presents to maternity admissions for abdominal pain, nausea, and vomiting.  ?Past Medical History:  ?Diagnosis Date  ? Anemia   ? History of chicken pox   ? History of miscarriage   ? Protein S deficiency (HCC)   ? ?Past Surgical History:  ?Procedure Laterality Date  ? CESAREAN SECTION    ? CESAREAN SECTION N/A 12/31/2020  ? Procedure: CESAREAN SECTION;  Surgeon: Maxie Better, MD;  Location: [facility] LD [facility]S;  Service: Obstetrics;  Laterality: N/A;  ? ?Social History  ? ?Socioeconomic History  ? Marital status: Single  ?  Spouse name: Not on file  ? Number of children: 1  ? Years of education: Not on file  ? Highest education level: Not on file  ?Occupational History  ? Not on file  ?Tobacco Use  ? Smoking status: Never  ? Smokeless tobacco: Never  ?Vaping Use  ? Vaping Use: Never used  ?Substance and Sexual Activity  ? Alcohol use: Not Currently  ?  Comment: occasional  ? Drug use: No  ? Sexual activity: Not Currently  ?Other Topics Concern  ? Not on file  ?Social History Narrative  ? 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   ? ?Social Determinants of Health  ? ?Financial Resource Strain: Not on file  ?Food Insecurity: Not on file  ?Transportation Needs: Not on file  ?Physical Activity: Not on file  ?Stress: Not on file  ?Social Connections: Not on file  ?Intimate Partner Violence: Not on file  ? ?No current facility-administered medications on file prior to encounter.  ? ?Current Outpatient Medications on File Prior to Encounter  ?Medication Sig Dispense Refill  ? acetaminophen (TYLENOL) 500 MG tablet Take 1,000 mg by mouth 2 (two) times daily as needed (pain.).    ? enoxaparin (LOVENOX) 100 MG/ML injection Inject 1 mL (100 mg total) into the skin daily. 30 mL 1  ? iron polysaccharides (NIFEREX) 150 MG capsule Take 150 mg by mouth daily.    ?  oxyCODONE (OXY IR/ROXICODONE) 5 MG immediate release tablet Take 1-2 tablets (5-10 mg total) by mouth every 4 (four) hours as needed for moderate pain. 30 tablet 0  ? Prenatal MV & Min w/FA-DHA (PRENATAL GUMMIES PO) Take 2 tablets by mouth every evening.    ? ?No Known Allergies ? ?ROS:  ?Review of Systems  ?Gastrointestinal:  Positive for abdominal pain, nausea and vomiting.  ? ?I have reviewed patient's Past Medical Hx, Surgical Hx, Family Hx, Social Hx, medications and allergies.  ? ?Physical Exam  ?Patient Vitals for the past 24 hrs: ? BP Temp Temp src Pulse Resp SpO2 Height Weight  ?04/27/22 0119 113/90 98.4 ?F (36.9 ?C) Oral 89 (!) 21 100 % 5\' 6"  (1.676 m) 118.8 kg  ? ?Physical Exam ?Constitutional:   ?   Appearance: She is ill-appearing.  ?Neurological:  ?   Mental Status: She is oriented to person, place, and time.  ? ? ?MDM ?Patient laying in bed on her stomach, she requests transfer to [facility] ED ?Urine pregnancy test negative.  ? ?ASSESSMENT ?MSE Complete ?Report given to [facility] in [facility] ED  ? ?PLAN ? ?Transfer to [facility] ED  ? ?[facility], NP ?04/27/2022 1:32 AM   ?

## 2022-04-27 NOTE — Progress Notes (Signed)
PHARMACIST - PHYSICIAN COMMUNICATION ? ?CONCERNING:  Enoxaparin (Lovenox) for DVT Prophylaxis  ? ? ?RECOMMENDATION: ?Patient was prescribed enoxaprin 40mg  q24 hours for VTE prophylaxis.  ? ?Filed Weights  ? 04/27/22 0119 04/27/22 0200 04/27/22 1356  ?Weight: 118.8 kg (261 lb 12.8 oz) 117.9 kg (260 lb) 117.9 kg (260 lb)  ? ? ?Body mass index is 41.99 kg/m?. ? ?Estimated Creatinine Clearance: 120.8 mL/min (by C-G formula based on SCr of 0.74 mg/dL). ? ? ?Based on [facility] policy patient is candidate for enoxaparin 0.5mg /kg TBW SQ every 24 hours based on BMI being >30. ? ?DESCRIPTION: ?Pharmacy has adjusted enoxaparin dose per [facility] policy. ? ?Patient is now receiving enoxaparin 60 mg every 24 hours  ? ? ?[facility], PharmD ?Clinical Pharmacist  ?04/27/2022 ?8:47 PM ? ?

## 2022-04-27 NOTE — H&P (Addendum)
? ?[name] ?[birth year]  ?[id number].   ? ?Requesting MD: Dr. Posey ReaKommor ?Chief Complaint/Reason for Consult: appendicitis ? ?HPI:  ?[name] is a 41 yo female who presented to the ED early this morning with abdominal pain. She began having right sided discomfort about 4 days ago, which became much more severe over the last day. She now has difficulty moving due to the pain. She endorses subjective chills at home. No vomiting. Labs in the ED are significant for a WBC of 19. A CT scan showed acute appendicitis without perforation. ? ?Her only prior abdominal surgery is a C section. She is otherwise in good health. ? ?ROS: ?Review of Systems  ?Constitutional:  Positive for chills. Negative for fever.  ?Respiratory:  Negative for shortness of breath and wheezing.   ?Gastrointestinal:  Positive for abdominal pain and nausea. Negative for vomiting.  ?Neurological:  Negative for loss of consciousness and weakness.  ? ?Family History  ?Problem Relation Age of Onset  ? Cancer Father   ? Hypertension Father   ? Cancer Sister   ? ? ?Past Medical History:  ?Diagnosis Date  ? Anemia   ? History of chicken pox   ? History of miscarriage   ? Protein S deficiency (HCC)   ? ? ?Past Surgical History:  ?Procedure Laterality Date  ? CESAREAN SECTION    ? CESAREAN SECTION N/A 12/31/2020  ? Procedure: CESAREAN SECTION;  Surgeon: Maxie Betterousins, Sheronette, MD;  Location: [facility];  Service: Obstetrics;  Laterality: N/A;  ? ? ?Social History:  reports that she has never smoked. She has never used smokeless tobacco. She reports that she does not currently use alcohol. She reports that she does not use drugs. ? ?Allergies: No Known Allergies ? ?(Not in a hospital admission) ? ? ? ?Physical Exam: ?Blood pressure 124/74, pulse 99, temperature 98.5 ?F (36.9 ?C), temperature source Oral, resp. rate 17, height 5\' 6"  (1.676 m), weight 117.9 kg, last menstrual period 04/11/2022, SpO2 97 %, unknown if currently breastfeeding. ?General: resting  comfortably, appears stated age, no apparent distress ?Neurological: alert and oriented, no focal deficits, cranial nerves grossly in tact ?HEENT: normocephalic, atraumatic, oropharynx clear, no scleral icterus ?CV: tachycardic 100s, extremities warm and well-perfused ?Respiratory: normal work of breathing on room air, symmetric chest wall expansion ?Abdomen: soft, nondistended, focally tender to palpation in the RLQ. ?Extremities: warm and well-perfused, no deformities, moving all extremities spontaneously ?Psychiatric: normal mood and affect ?Skin: warm and dry, no jaundice, no rashes or lesions ? ? ?Results for orders placed or performed during the hospital encounter of 04/27/22 (from the past 48 hour(s))  ?Pregnancy, urine POC     Status: None  ? Collection Time: 04/27/22  1:26 AM  ?Result Value Ref Range  ? Preg Test, Ur NEGATIVE NEGATIVE  ?  Comment:        ?THE SENSITIVITY OF THIS ?METHODOLOGY IS >24 mIU/mL ?  ?Urinalysis, Routine w reflex microscopic Urine, Clean Catch     Status: Abnormal  ? Collection Time: 04/27/22  2:02 AM  ?Result Value Ref Range  ? Color, Urine YELLOW YELLOW  ? APPearance CLEAR CLEAR  ? Specific Gravity, Urine 1.023 1.005 - 1.030  ? pH 8.0 5.0 - 8.0  ? Glucose, UA 50 (A) NEGATIVE mg/dL  ? Hgb urine dipstick SMALL (A) NEGATIVE  ? Bilirubin Urine NEGATIVE NEGATIVE  ? Ketones, ur NEGATIVE NEGATIVE mg/dL  ? Protein, ur 100 (A) NEGATIVE mg/dL  ? Nitrite NEGATIVE NEGATIVE  ? Leukocytes,Ua NEGATIVE NEGATIVE  ?  RBC / HPF 6-10 0 - 5 RBC/hpf  ? WBC, UA 0-5 0 - 5 WBC/hpf  ? Bacteria, UA RARE (A) NONE SEEN  ? Squamous Epithelial / LPF 0-5 0 - 5  ? Mucus PRESENT   ?  Comment: Performed at [facility], [address], [place], [place] [address]  ?Comprehensive metabolic panel     Status: Abnormal  ? Collection Time: 04/27/22  2:09 AM  ?Result Value Ref Range  ? Sodium 135 135 - 145 mmol/L  ? Potassium 3.8 3.5 - 5.1 mmol/L  ? Chloride 106 98 - 111 mmol/L  ? CO2 23 22 - 32 mmol/L  ? Glucose,  Bld 143 (H) 70 - 99 mg/dL  ?  Comment: Glucose reference range applies only to samples taken after fasting for at least 8 hours.  ? BUN 8 6 - 20 mg/dL  ? Creatinine, Ser 0.74 0.44 - 1.00 mg/dL  ? Calcium 9.0 8.9 - 10.3 mg/dL  ? Total Protein 7.6 6.5 - 8.1 g/dL  ? Albumin 3.9 3.5 - 5.0 g/dL  ? AST 15 15 - 41 U/L  ? ALT 14 0 - 44 U/L  ? Alkaline Phosphatase 90 38 - 126 U/L  ? Total Bilirubin 0.4 0.3 - 1.2 mg/dL  ? GFR, Estimated >60 >60 mL/min  ?  Comment: (NOTE) ?Calculated using the CKD-EPI Creatinine Equation (2021) ?  ? Anion gap 6 5 - 15  ?  Comment: Performed at [facility], [address], [place], [place] [address]  ?Lipase, blood     Status: None  ? Collection Time: 04/27/22  2:09 AM  ?Result Value Ref Range  ? Lipase 30 11 - 51 U/L  ?  Comment: Performed at [facility], [address], [place], [place] [address]  ?CBC with Diff     Status: Abnormal  ? Collection Time: 04/27/22  2:09 AM  ?Result Value Ref Range  ? WBC 19.6 (H) 4.0 - 10.5 K/uL  ? RBC 5.01 3.87 - 5.11 MIL/uL  ? Hemoglobin 9.5 (L) 12.0 - 15.0 g/dL  ? HCT 32.7 (L) 36.0 - 46.0 %  ? MCV 65.3 (L) 80.0 - 100.0 fL  ? MCH 19.0 (L) 26.0 - 34.0 pg  ? MCHC 29.1 (L) 30.0 - 36.0 g/dL  ? RDW 18.3 (H) 11.5 - 15.5 %  ? Platelets 336 150 - 400 K/uL  ? nRBC 0.0 0.0 - 0.2 %  ? Neutrophils Relative % 90 %  ? Neutro Abs 17.8 (H) 1.7 - 7.7 K/uL  ? Lymphocytes Relative 6 %  ? Lymphs Abs 1.2 0.7 - 4.0 K/uL  ? Monocytes Relative 3 %  ? Monocytes Absolute 0.5 0.1 - 1.0 K/uL  ? Eosinophils Relative 0 %  ? Eosinophils Absolute 0.0 0.0 - 0.5 K/uL  ? Basophils Relative 0 %  ? Basophils Absolute 0.0 0.0 - 0.1 K/uL  ? Immature Granulocytes 1 %  ? Abs Immature Granulocytes 0.13 (H) 0.00 - 0.07 K/uL  ?  Comment: Performed at [facility], [address], [place], [place] [address]  ?I-Stat beta hCG blood, ED     Status: Abnormal  ? Collection Time: 04/27/22  2:33 AM  ?Result Value Ref Range  ? I-stat hCG, quantitative 7.3 (H) <5 mIU/mL  ? Comment 3           ?  Comment:   GEST. AGE      CONC.  (mIU/mL) ?  <=1 WEEK  5 - 50 ?    2 WEEKS       50 - 500 ?    3 WEEKS       100 - 10,000 ?    4 WEEKS     1,000 - 30,000 ?       ?FEMALE AND NON-PREGNANT FEMALE: ?    LESS THAN 5 mIU/mL ?  ? ?CT ABDOMEN PELVIS W CONTRAST ? ?Result Date: 04/27/2022 ?CLINICAL DATA:  41 year old female with right lower quadrant abdominal pain. Flank pain. EXAM: CT ABDOMEN AND PELVIS WITH CONTRAST TECHNIQUE: Multidetector CT imaging of the abdomen and pelvis was performed using the standard protocol following bolus administration of intravenous contrast. RADIATION DOSE REDUCTION: This exam was performed according to the departmental dose-optimization program which includes automated exposure control, adjustment of the mA and/or kV according to patient size and/or use of iterative reconstruction technique. CONTRAST:  OMNIPAQUE IOHEXOL 300 MG/ML  SOLN COMPARISON:  CT Abdomen and Pelvis 06/14/2007. CTA chest 01/06/2021. FINDINGS: Lower chest: Negative. Hepatobiliary: Negative liver and gallbladder. Pancreas: Negative. Spleen: Negative. Adrenals/Urinary Tract: Normal left adrenal gland. Nodular, oval enlarged right adrenal gland up to 3.2 cm with 40 Hounsfield units internal density today, -6 Hounsfield units on the 2022 CTA. Size appears stable since 2022. Kidneys appears symmetric and negative. Diminutive and negative bladder. Occasional pelvic phleboliths. Stomach/Bowel: Mostly decompressed large bowel throughout the abdomen and pelvis. Inflamed appendix. Appendix: Location: Retrocecal and lateral to the cecum (series 4, image 53) Diameter: Up to 15 mm Appendicolith: Probable (series 4, image 57). Mucosal hyper-enhancement: Mild Extraluminal gas: Negative Periappendiceal collection: Regional mesenteric inflammation with no organized fluid collection. Trace free fluid in the right gutter. Terminal ileum is decompressed and appears relatively spared. No dilated large bowel. Largely decompressed  stomach. No free air. Vascular/Lymphatic: Suboptimal intravascular contrast bolus but the major arterial structures appear to be patent. No calcified atherosclerosis or lymphadenopathy identified. Reproducti

## 2022-04-28 ENCOUNTER — Encounter (HOSPITAL_COMMUNITY): Payer: Self-pay | Admitting: Surgery

## 2022-04-28 LAB — BASIC METABOLIC PANEL
Anion gap: 6 (ref 5–15)
BUN: 6 mg/dL (ref 6–20)
CO2: 23 mmol/L (ref 22–32)
Calcium: 8.3 mg/dL — ABNORMAL LOW (ref 8.9–10.3)
Chloride: 110 mmol/L (ref 98–111)
Creatinine, Ser: 0.79 mg/dL (ref 0.44–1.00)
GFR, Estimated: >60 mL/min (ref >60)
Glucose, Bld: 121 mg/dL — ABNORMAL HIGH (ref 70–99)
Potassium: 3.4 mmol/L — ABNORMAL LOW (ref 3.5–5.1)
Sodium: 139 mmol/L (ref 135–145)

## 2022-04-28 LAB — URINE CULTURE

## 2022-04-28 LAB — CBC
HCT: 29.4 % — ABNORMAL LOW (ref 36.0–46.0)
Hemoglobin: 8.7 g/dL — ABNORMAL LOW (ref 12.0–15.0)
MCH: 19.3 pg — ABNORMAL LOW (ref 26.0–34.0)
MCHC: 29.6 g/dL — ABNORMAL LOW (ref 30.0–36.0)
MCV: 65.2 fL — ABNORMAL LOW (ref 80.0–100.0)
Platelets: 296 10*3/uL (ref 150–400)
RBC: 4.51 MIL/uL (ref 3.87–5.11)
RDW: 18.2 % — ABNORMAL HIGH (ref 11.5–15.5)
WBC: 24.3 10*3/uL — ABNORMAL HIGH (ref 4.0–10.5)
nRBC: 0 % (ref 0.0–0.2)

## 2022-04-28 MED ORDER — IBUPROFEN 400 MG PO TABS: 400.0000 mg | ORAL_TABLET | Freq: Four times a day (QID) | ORAL | Status: DC | PRN

## 2022-04-28 MED ORDER — ACETAMINOPHEN 500 MG PO TABS: 1000.0000 mg | ORAL_TABLET | Freq: Four times a day (QID) | ORAL | 0 refills | Status: AC | PRN

## 2022-04-28 MED ORDER — POTASSIUM CHLORIDE 20 MEQ PO PACK
40.0000 meq | PACK | Freq: Once | ORAL | Status: DC
Start: 2022-04-28 — End: 2022-04-28
  Filled 2022-04-28: qty 2

## 2022-04-28 MED ORDER — METHOCARBAMOL 500 MG PO TABS: 500.0000 mg | ORAL_TABLET | Freq: Four times a day (QID) | ORAL | 0 refills | Status: AC | PRN

## 2022-04-28 MED ORDER — IBUPROFEN 200 MG PO TABS: 400.0000 mg | ORAL_TABLET | Freq: Four times a day (QID) | ORAL | Status: AC | PRN

## 2022-04-28 MED ORDER — DOCUSATE SODIUM 100 MG PO CAPS: 100.0000 mg | ORAL_CAPSULE | Freq: Two times a day (BID) | ORAL | 0 refills | Status: AC | PRN

## 2022-04-28 MED ORDER — ALUM & MAG HYDROXIDE-SIMETH 200-200-20 MG/5ML PO SUSP
15.0000 mL | Freq: Four times a day (QID) | ORAL | Status: DC | PRN
  Administered 2022-04-28: 15 mL via ORAL
  Filled 2022-04-28: qty 30

## 2022-04-28 MED ORDER — METHOCARBAMOL 500 MG PO TABS: 500.0000 mg | ORAL_TABLET | Freq: Four times a day (QID) | ORAL | Status: DC | PRN

## 2022-04-28 NOTE — Plan of Care (Signed)
Patient arrived from PACU sleepy but oriented to situation. Patient settled into room, assessment completed Will continue to monitor according to orders and plan of care. ?

## 2022-04-28 NOTE — Discharge Summary (Addendum)
[facility] ?Discharge Summary  ? ?Patient ID: ?[name] ?MRN: [id number] ?DOB/AGE: 42-Mar-1982 42 y.o. ? ?Admit date: 04/27/2022 ?Discharge date: 04/28/2022 ? ?Admitting Diagnosis: ?Appendicitis  ? ?Discharge Diagnosis ?Patient Active Problem List  ? Diagnosis Date Noted  ? Acute appendicitis 04/27/2022  ? Previous cesarean section complicating pregnancy [id number]  ? Postpartum care following cesarean delivery 12/31/2020  ? Previous cesarean section complicating pregnancy, antepartum condition or complication [id number]  ? Anemia affecting pregnancy in third trimester 10/17/2020  ? Protein S deficiency affecting pregnancy ([facility]) 02/02/2020  ? ? ?Consultants ?None  ? ?Imaging: ?CT ABDOMEN PELVIS W CONTRAST ? ?Result Date: 04/27/2022 ?CLINICAL DATA:  41 year old female with right lower quadrant abdominal pain. Flank pain. EXAM: CT ABDOMEN AND PELVIS WITH CONTRAST TECHNIQUE: Multidetector CT imaging of the abdomen and pelvis was performed using the standard protocol following bolus administration of intravenous contrast. RADIATION DOSE REDUCTION: This exam was performed according to the departmental dose-optimization program which includes automated exposure control, adjustment of the mA and/or kV according to patient size and/or use of iterative reconstruction technique. CONTRAST:  12mL OMNIPAQUE IOHEXOL 300 MG/ML  SOLN COMPARISON:  CT Abdomen and Pelvis 06/14/2007. CTA chest 01/06/2021. FINDINGS: Lower chest: Negative. Hepatobiliary: Negative liver and gallbladder. Pancreas: Negative. Spleen: Negative. Adrenals/Urinary Tract: Normal left adrenal gland. Nodular, oval enlarged right adrenal gland up to 3.2 cm with 40 Hounsfield units internal density today, -6 Hounsfield units on the 2022 CTA. Size appears stable since 2022. Kidneys appears symmetric and negative. Diminutive and negative bladder. Occasional pelvic phleboliths. Stomach/Bowel: Mostly decompressed large bowel throughout the abdomen and  pelvis. Inflamed appendix. Appendix: Location: Retrocecal and lateral to the cecum (series 4, image 53) Diameter: Up to 15 mm Appendicolith: Probable (series 4, image 57). Mucosal hyper-enhancement: Mild Extraluminal gas: Negative Periappendiceal collection: Regional mesenteric inflammation with no organized fluid collection. Trace free fluid in the right gutter. Terminal ileum is decompressed and appears relatively spared. No dilated large bowel. Largely decompressed stomach. No free air. Vascular/Lymphatic: Suboptimal intravascular contrast bolus but the major arterial structures appear to be patent. No calcified atherosclerosis or lymphadenopathy identified. Reproductive: Negative. Other: No pelvic free fluid. Musculoskeletal: No acute osseous abnormality identified. IMPRESSION: 1. Positive for Acute Appendicitis. Probable appendicolith. Periappendiceal inflammation with no evidence of perforation or abscess. 2. 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 Reference: JACR 2017 Aug; 14(8):1038-44, JCAT 2016 Mar-Apr; 40(2):194-200. Electronically Signed   By: Genevie Ann M.D.   On: 04/27/2022 07:28   ? ?Procedures ? ?Dr. Michaelle Birks (04/27/2022)- Laparoscopic Appendectomy, oversew of cecal serosal tear with 3-0 silk. ? ?HPI:  ?[name] is a 41 yo female who presented to the ED early this morning with abdominal pain. She began having right sided discomfort about 4 days ago, which became much more severe over the last day. She now has difficulty moving due to the pain. She endorses subjective chills at home. No vomiting. Labs in the ED are significant for a WBC of 19. A CT scan showed acute appendicitis without perforation. ?  ?Her only prior abdominal surgery is a C section. She is otherwise in good health. ? ?Hospital Course:  ?Patient was admitted and underwent procedure listed above where the appendix was noted to be gangrenous. Tolerated procedure well and  was transferred to the floor.  Diet was advanced as tolerated.  On POD#1, the patient was voiding well, tolerating diet, ambulating well, pain well controlled, vital signs stable, incisions c/d/i and felt  stable for discharge home.  Patient has a adrenal nodule that will need surveillance, therefore she will follow up with Dr. Zenia Resides as below for post-op care as well as discussion regarding adrenal nodule. Pts hcg was noted to be very minimally elevated (7) so she will need follow up pregnancy test and OB/Gyn care. ? ?Physical Exam: ?General:  Alert, NAD, pleasant, comfortable ?Abd:  Soft, ND, mild tenderness, incisions C/D/I ?Allergies as of 04/28/2022   ?No Known Allergies ?  ? ?  ?Medication List  ?  ? ?STOP taking these medications   ? ?enoxaparin 100 MG/ML injection ?Commonly known as: Lovenox ?  ? ?  ? ?TAKE these medications   ? ?acetaminophen 500 MG tablet ?Commonly known as: TYLENOL ?Take 2 tablets (1,000 mg total) by mouth every 6 (six) hours as needed. ?What changed:  ?when to take this ?reasons to take this ?  ?docusate sodium 100 MG capsule ?Commonly known as: COLACE ?Take 1 capsule (100 mg total) by mouth 2 (two) times daily as needed for mild constipation. ?  ?ibuprofen 200 MG tablet ?Commonly known as: ADVIL ?Take 2-3 tablets (400-600 mg total) by mouth every 6 (six) hours as needed for mild pain or moderate pain. ?  ?methocarbamol 500 MG tablet ?Commonly known as: ROBAXIN ?Take 1 tablet (500 mg total) by mouth every 6 (six) hours as needed for muscle spasms. ?  ?PRENATAL GUMMIES PO ?Take 2 tablets by mouth every evening. ?  ? ?  ? ? ? ? Follow-up Information   ? ? Dwan Bolt, MD Follow up.   ?Specialty: General Surgery ?Why: our office is scheduling you for post-operative follow up in about 4 weeks. please call to confirm appointment date/time. ?Contact information: ?[address] ?[place] [address] ?[phone] ? ? ?  ?  ? ? Make an appointment with your OB-Gyn Follow up on  04/29/2022.   ?Why: blood hcg level was slightly elevated, possibility of pregnancy. ? ?  ?  ? ?  ?  ? ?  ? ? ?Signed: ?Obie Dredge, PA-C ?[facility] ?04/28/2022, 9:17 AM ? ?

## 2022-04-30 LAB — SURGICAL PATHOLOGY

## 2023-02-21 ENCOUNTER — Encounter: Payer: Self-pay | Admitting: Internal Medicine

## 2023-04-23 ENCOUNTER — Other Ambulatory Visit: Payer: Self-pay | Admitting: Surgery

## 2023-04-23 DIAGNOSIS — E278 Other specified disorders of adrenal gland: Secondary | ICD-10-CM

## 2023-04-23 DIAGNOSIS — Z9049 Acquired absence of other specified parts of digestive tract: Secondary | ICD-10-CM

## 2024-06-04 ENCOUNTER — Encounter: Payer: Self-pay | Admitting: Internal Medicine

## 2024-12-26 ENCOUNTER — Encounter: Payer: Self-pay | Admitting: Internal Medicine
